# Patient Record
Sex: Female | Born: 1940 | Race: White | Marital: Married | Smoking: Current every day smoker
Health system: Southern US, Community
[De-identification: ages and names within clinical notes are randomized; demographics above are authoritative.]

## PROBLEM LIST (undated history)

## (undated) DIAGNOSIS — C3490 Malignant neoplasm of unspecified part of unspecified bronchus or lung: Secondary | ICD-10-CM

## (undated) DIAGNOSIS — E7800 Pure hypercholesterolemia, unspecified: Secondary | ICD-10-CM

## (undated) DIAGNOSIS — G43909 Migraine, unspecified, not intractable, without status migrainosus: Secondary | ICD-10-CM

## (undated) DIAGNOSIS — I10 Essential (primary) hypertension: Secondary | ICD-10-CM

## (undated) DIAGNOSIS — S32009A Unspecified fracture of unspecified lumbar vertebra, initial encounter for closed fracture: Secondary | ICD-10-CM

## (undated) DIAGNOSIS — G8929 Other chronic pain: Secondary | ICD-10-CM

## (undated) DIAGNOSIS — J449 Chronic obstructive pulmonary disease, unspecified: Secondary | ICD-10-CM

## (undated) DIAGNOSIS — C801 Malignant (primary) neoplasm, unspecified: Secondary | ICD-10-CM

## (undated) DIAGNOSIS — M797 Fibromyalgia: Secondary | ICD-10-CM

## (undated) DIAGNOSIS — M549 Dorsalgia, unspecified: Secondary | ICD-10-CM

## (undated) DIAGNOSIS — I509 Heart failure, unspecified: Secondary | ICD-10-CM

## (undated) DIAGNOSIS — I219 Acute myocardial infarction, unspecified: Secondary | ICD-10-CM

## (undated) DIAGNOSIS — J439 Emphysema, unspecified: Secondary | ICD-10-CM

## (undated) HISTORY — PX: TONSILLECTOMY: SUR1361

## (undated) HISTORY — PX: ABDOMINAL HYSTERECTOMY: SHX81

## (undated) HISTORY — PX: MASTECTOMY: SHX3

## (undated) HISTORY — PX: EYE SURGERY: SHX253

## (undated) HISTORY — DX: Malignant neoplasm of unspecified part of unspecified bronchus or lung: C34.90

## (undated) HISTORY — PX: INGUINAL HERNIA REPAIR: SUR1180

---

## 2005-12-15 ENCOUNTER — Ambulatory Visit: Payer: Self-pay | Admitting: Specialist

## 2005-12-21 ENCOUNTER — Ambulatory Visit: Payer: Self-pay | Admitting: Specialist

## 2005-12-29 ENCOUNTER — Ambulatory Visit: Payer: Self-pay | Admitting: Specialist

## 2006-02-28 ENCOUNTER — Ambulatory Visit: Payer: Self-pay | Admitting: Specialist

## 2007-08-14 ENCOUNTER — Ambulatory Visit: Payer: Self-pay | Admitting: Family Medicine

## 2007-08-26 ENCOUNTER — Ambulatory Visit: Payer: Self-pay | Admitting: Family Medicine

## 2008-11-13 ENCOUNTER — Ambulatory Visit: Payer: Self-pay | Admitting: Family Medicine

## 2008-11-26 ENCOUNTER — Ambulatory Visit: Payer: Self-pay | Admitting: Internal Medicine

## 2009-05-04 ENCOUNTER — Ambulatory Visit: Payer: Self-pay | Admitting: Surgery

## 2009-05-13 ENCOUNTER — Ambulatory Visit: Payer: Self-pay | Admitting: Internal Medicine

## 2009-05-21 ENCOUNTER — Ambulatory Visit: Payer: Self-pay | Admitting: Surgery

## 2009-05-31 ENCOUNTER — Ambulatory Visit: Payer: Self-pay | Admitting: Surgery

## 2009-06-03 ENCOUNTER — Ambulatory Visit: Payer: Self-pay | Admitting: Surgery

## 2010-05-15 HISTORY — PX: KYPHOPLASTY: SHX5884

## 2010-10-14 ENCOUNTER — Ambulatory Visit: Payer: Self-pay | Admitting: Internal Medicine

## 2010-10-15 ENCOUNTER — Inpatient Hospital Stay: Payer: Self-pay | Admitting: Internal Medicine

## 2010-11-07 ENCOUNTER — Ambulatory Visit: Payer: Self-pay

## 2010-11-09 ENCOUNTER — Ambulatory Visit: Payer: Self-pay | Admitting: Specialist

## 2010-11-11 ENCOUNTER — Ambulatory Visit: Payer: Self-pay | Admitting: Specialist

## 2010-11-13 ENCOUNTER — Ambulatory Visit: Payer: Self-pay

## 2010-11-21 ENCOUNTER — Ambulatory Visit: Payer: Self-pay | Admitting: Cardiothoracic Surgery

## 2010-11-22 ENCOUNTER — Telehealth: Payer: Self-pay

## 2010-11-22 DIAGNOSIS — C3490 Malignant neoplasm of unspecified part of unspecified bronchus or lung: Secondary | ICD-10-CM

## 2010-11-22 NOTE — Telephone Encounter (Signed)
Pt scheduled for EUS need to review meds and instruct pt  Left message on machine to call back  

## 2010-11-22 NOTE — Telephone Encounter (Signed)
Pt aware of appt and has been instructed as well as meds reviewed

## 2010-11-24 ENCOUNTER — Ambulatory Visit: Payer: Self-pay

## 2010-11-24 ENCOUNTER — Encounter: Payer: Self-pay | Admitting: Gastroenterology

## 2010-12-07 ENCOUNTER — Encounter: Payer: Self-pay | Admitting: Gastroenterology

## 2010-12-14 ENCOUNTER — Ambulatory Visit: Payer: Self-pay | Admitting: Cardiothoracic Surgery

## 2011-01-05 ENCOUNTER — Ambulatory Visit: Payer: Self-pay | Admitting: Pain Medicine

## 2011-01-09 ENCOUNTER — Inpatient Hospital Stay: Payer: Self-pay | Admitting: Internal Medicine

## 2011-01-14 ENCOUNTER — Ambulatory Visit: Payer: Self-pay | Admitting: Cardiothoracic Surgery

## 2011-02-13 ENCOUNTER — Ambulatory Visit: Payer: Self-pay | Admitting: Cardiothoracic Surgery

## 2011-03-16 ENCOUNTER — Ambulatory Visit: Payer: Self-pay | Admitting: Cardiothoracic Surgery

## 2011-03-27 ENCOUNTER — Inpatient Hospital Stay: Payer: Self-pay | Admitting: Specialist

## 2011-04-15 ENCOUNTER — Ambulatory Visit: Payer: Self-pay | Admitting: Cardiothoracic Surgery

## 2011-05-02 ENCOUNTER — Ambulatory Visit: Payer: Self-pay | Admitting: Surgery

## 2011-05-10 ENCOUNTER — Ambulatory Visit: Payer: Self-pay | Admitting: Unknown Physician Specialty

## 2011-05-11 ENCOUNTER — Ambulatory Visit: Payer: Self-pay | Admitting: Unknown Physician Specialty

## 2011-05-15 LAB — PATHOLOGY REPORT

## 2011-05-16 ENCOUNTER — Ambulatory Visit: Payer: Self-pay | Admitting: Cardiothoracic Surgery

## 2011-05-16 DIAGNOSIS — C801 Malignant (primary) neoplasm, unspecified: Secondary | ICD-10-CM

## 2011-05-16 DIAGNOSIS — I219 Acute myocardial infarction, unspecified: Secondary | ICD-10-CM

## 2011-05-16 HISTORY — DX: Acute myocardial infarction, unspecified: I21.9

## 2011-05-16 HISTORY — DX: Malignant (primary) neoplasm, unspecified: C80.1

## 2011-05-30 ENCOUNTER — Ambulatory Visit: Payer: Self-pay | Admitting: Unknown Physician Specialty

## 2011-06-01 ENCOUNTER — Ambulatory Visit: Payer: Self-pay | Admitting: Unknown Physician Specialty

## 2011-07-03 ENCOUNTER — Ambulatory Visit: Payer: Self-pay | Admitting: Internal Medicine

## 2011-07-03 LAB — BASIC METABOLIC PANEL
Anion Gap: 12 (ref 7–16)
BUN: 4 mg/dL — ABNORMAL LOW (ref 7–18)
Calcium, Total: 9.6 mg/dL (ref 8.5–10.1)
Co2: 30 mmol/L (ref 21–32)
Creatinine: 0.56 mg/dL — ABNORMAL LOW (ref 0.60–1.30)
EGFR (African American): >60
EGFR (Non-African Amer.): >60
Osmolality: 282 (ref 275–301)
Potassium: 3.5 mmol/L (ref 3.5–5.1)
Sodium: 142 mmol/L (ref 136–145)

## 2011-07-03 LAB — CBC CANCER CENTER
Basophil #: 0 x10 3/mm (ref 0.0–0.1)
Eosinophil #: 0 x10 3/mm (ref 0.0–0.7)
HGB: 14.5 g/dL (ref 12.0–16.0)
Lymphocyte #: 0.4 x10 3/mm — ABNORMAL LOW (ref 1.0–3.6)
MCH: 36.1 pg — ABNORMAL HIGH (ref 26.0–34.0)
MCHC: 33.5 g/dL (ref 32.0–36.0)
MCV: 108 fL — ABNORMAL HIGH (ref 80–100)
Monocyte #: 0.7 x10 3/mm (ref 0.0–0.7)
Monocyte %: 12.1 %
Neutrophil #: 4.4 x10 3/mm (ref 1.4–6.5)
RDW: 15 % — ABNORMAL HIGH (ref 11.5–14.5)

## 2011-07-14 ENCOUNTER — Ambulatory Visit: Payer: Self-pay | Admitting: Internal Medicine

## 2011-07-14 ENCOUNTER — Ambulatory Visit: Payer: Self-pay

## 2011-07-18 LAB — HEPATIC FUNCTION PANEL A (ARMC)
Albumin: 3.1 g/dL — ABNORMAL LOW (ref 3.4–5.0)
Bilirubin, Direct: 0.1 mg/dL (ref 0.00–0.20)
Bilirubin,Total: 0.5 mg/dL (ref 0.2–1.0)
SGOT(AST): 15 U/L (ref 15–37)

## 2011-07-18 LAB — BASIC METABOLIC PANEL
Anion Gap: 9 (ref 7–16)
BUN: 8 mg/dL (ref 7–18)
Calcium, Total: 9.2 mg/dL (ref 8.5–10.1)
Chloride: 100 mmol/L (ref 98–107)
Co2: 31 mmol/L (ref 21–32)
EGFR (African American): >60
EGFR (Non-African Amer.): >60
Glucose: 109 mg/dL — ABNORMAL HIGH (ref 65–99)
Osmolality: 278 (ref 275–301)
Potassium: 4 mmol/L (ref 3.5–5.1)
Sodium: 140 mmol/L (ref 136–145)

## 2011-07-18 LAB — CBC CANCER CENTER
Basophil %: 0.2 %
Eosinophil #: 0.1 x10 3/mm (ref 0.0–0.7)
Eosinophil %: 1.4 %
HCT: 44.1 % (ref 35.0–47.0)
HGB: 14.7 g/dL (ref 12.0–16.0)
Lymphocyte #: 0.7 x10 3/mm — ABNORMAL LOW (ref 1.0–3.6)
Lymphocyte %: 14.3 %
MCH: 35.2 pg — ABNORMAL HIGH (ref 26.0–34.0)
MCHC: 33.4 g/dL (ref 32.0–36.0)
MCV: 106 fL — ABNORMAL HIGH (ref 80–100)
Monocyte #: 0.6 x10 3/mm (ref 0.0–0.7)
Monocyte %: 12.4 %
Neutrophil #: 3.6 x10 3/mm (ref 1.4–6.5)
Neutrophil %: 71.7 %

## 2011-07-24 LAB — URINALYSIS, COMPLETE
Bilirubin,UR: NEGATIVE
Ph: 7 (ref 4.5–8.0)
Specific Gravity: 1.005 (ref 1.003–1.030)
Squamous Epithelial: 3

## 2011-07-25 LAB — URINE CULTURE

## 2011-08-14 ENCOUNTER — Ambulatory Visit: Payer: Self-pay

## 2011-08-14 ENCOUNTER — Ambulatory Visit: Payer: Self-pay | Admitting: Internal Medicine

## 2011-10-10 ENCOUNTER — Ambulatory Visit: Payer: Self-pay | Admitting: Internal Medicine

## 2011-10-10 LAB — CBC CANCER CENTER
Basophil #: 0.1 x10 3/mm (ref 0.0–0.1)
Basophil %: 0.9 %
Eosinophil #: 0.3 x10 3/mm (ref 0.0–0.7)
HCT: 44 % (ref 35.0–47.0)
HGB: 14.6 g/dL (ref 12.0–16.0)
Lymphocyte %: 13.9 %
MCHC: 33.1 g/dL (ref 32.0–36.0)
MCV: 102 fL — ABNORMAL HIGH (ref 80–100)
Monocyte #: 0.8 x10 3/mm (ref 0.2–0.9)
Monocyte %: 13.3 %
Neutrophil %: 66.7 %
Platelet: 222 x10 3/mm (ref 150–440)
RBC: 4.31 10*6/uL (ref 3.80–5.20)
RDW: 14.6 % — ABNORMAL HIGH (ref 11.5–14.5)
WBC: 6.1 x10 3/mm (ref 3.6–11.0)

## 2011-10-10 LAB — BASIC METABOLIC PANEL
Anion Gap: 10 (ref 7–16)
BUN: 15 mg/dL (ref 7–18)
Calcium, Total: 9.3 mg/dL (ref 8.5–10.1)
Co2: 29 mmol/L (ref 21–32)
EGFR (African American): >60
EGFR (Non-African Amer.): >60
Glucose: 92 mg/dL (ref 65–99)
Potassium: 3.4 mmol/L — ABNORMAL LOW (ref 3.5–5.1)
Sodium: 144 mmol/L (ref 136–145)

## 2011-10-10 LAB — HEPATIC FUNCTION PANEL A (ARMC)
Alkaline Phosphatase: 77 U/L (ref 50–136)
SGPT (ALT): 21 U/L

## 2011-10-14 ENCOUNTER — Ambulatory Visit: Payer: Self-pay | Admitting: Internal Medicine

## 2011-11-10 ENCOUNTER — Inpatient Hospital Stay: Payer: Self-pay | Admitting: Internal Medicine

## 2011-11-10 LAB — URINALYSIS, COMPLETE
Ketone: NEGATIVE
Leukocyte Esterase: NEGATIVE
Ph: 7 (ref 4.5–8.0)
Protein: NEGATIVE
Squamous Epithelial: 4

## 2011-11-10 LAB — COMPREHENSIVE METABOLIC PANEL
Alkaline Phosphatase: 61 U/L (ref 50–136)
BUN: 14 mg/dL (ref 7–18)
Bilirubin,Total: 0.5 mg/dL (ref 0.2–1.0)
Chloride: 106 mmol/L (ref 98–107)
Creatinine: 0.74 mg/dL (ref 0.60–1.30)
EGFR (African American): >60
EGFR (Non-African Amer.): >60
Osmolality: 285 (ref 275–301)
Potassium: 3.6 mmol/L (ref 3.5–5.1)
SGPT (ALT): 17 U/L
Sodium: 143 mmol/L (ref 136–145)
Total Protein: 7.1 g/dL (ref 6.4–8.2)

## 2011-11-10 LAB — CBC WITH DIFFERENTIAL/PLATELET
Basophil #: 0 10*3/uL (ref 0.0–0.1)
Eosinophil #: 0.1 10*3/uL (ref 0.0–0.7)
Eosinophil %: 1.1 %
HGB: 13.8 g/dL (ref 12.0–16.0)
Lymphocyte %: 9.1 %
MCH: 34.9 pg — ABNORMAL HIGH (ref 26.0–34.0)
MCHC: 33.9 g/dL (ref 32.0–36.0)
MCV: 103 fL — ABNORMAL HIGH (ref 80–100)
Monocyte #: 1.2 x10 3/mm — ABNORMAL HIGH (ref 0.2–0.9)
Monocyte %: 13.4 %
Neutrophil #: 6.8 10*3/uL — ABNORMAL HIGH (ref 1.4–6.5)
Neutrophil %: 75.9 %
RBC: 3.96 10*6/uL (ref 3.80–5.20)
RDW: 14.1 % (ref 11.5–14.5)
WBC: 9 10*3/uL (ref 3.6–11.0)

## 2011-11-13 ENCOUNTER — Ambulatory Visit: Payer: Self-pay | Admitting: Internal Medicine

## 2011-11-15 LAB — CBC WITH DIFFERENTIAL/PLATELET
Basophil #: 0 10*3/uL (ref 0.0–0.1)
Eosinophil #: 0 10*3/uL (ref 0.0–0.7)
HCT: 41 % (ref 35.0–47.0)
Lymphocyte #: 0.4 10*3/uL — ABNORMAL LOW (ref 1.0–3.6)
MCHC: 34.2 g/dL (ref 32.0–36.0)
MCV: 103 fL — ABNORMAL HIGH (ref 80–100)
Neutrophil #: 5.8 10*3/uL (ref 1.4–6.5)
RDW: 14.3 % (ref 11.5–14.5)
WBC: 6.9 10*3/uL (ref 3.6–11.0)

## 2011-11-15 LAB — COMPREHENSIVE METABOLIC PANEL
Alkaline Phosphatase: 59 U/L (ref 50–136)
Bilirubin,Total: 0.6 mg/dL (ref 0.2–1.0)
Co2: 31 mmol/L (ref 21–32)
Creatinine: 0.53 mg/dL — ABNORMAL LOW (ref 0.60–1.30)
EGFR (Non-African Amer.): >60
Osmolality: 283 (ref 275–301)
Sodium: 139 mmol/L (ref 136–145)

## 2011-11-17 LAB — CBC WITH DIFFERENTIAL/PLATELET
Eosinophil %: 0.2 %
Monocyte #: 1.4 x10 3/mm — ABNORMAL HIGH (ref 0.2–0.9)
Monocyte %: 16.9 %
Neutrophil %: 72.3 %
Platelet: 226 10*3/uL (ref 150–440)
WBC: 8.4 10*3/uL (ref 3.6–11.0)

## 2011-11-17 LAB — BASIC METABOLIC PANEL
Anion Gap: 4 — ABNORMAL LOW (ref 7–16)
Calcium, Total: 8.9 mg/dL (ref 8.5–10.1)
Chloride: 100 mmol/L (ref 98–107)
Osmolality: 280 (ref 275–301)
Potassium: 3.9 mmol/L (ref 3.5–5.1)

## 2011-11-20 ENCOUNTER — Ambulatory Visit: Payer: Self-pay | Admitting: Specialist

## 2011-12-04 LAB — BASIC METABOLIC PANEL
Anion Gap: 5 — ABNORMAL LOW (ref 7–16)
Calcium, Total: 9.3 mg/dL (ref 8.5–10.1)
Co2: 34 mmol/L — ABNORMAL HIGH (ref 21–32)
Creatinine: 0.8 mg/dL (ref 0.60–1.30)
EGFR (African American): >60
Osmolality: 286 (ref 275–301)

## 2011-12-04 LAB — CBC CANCER CENTER
Basophil #: 0 x10 3/mm (ref 0.0–0.1)
Eosinophil #: 0.2 x10 3/mm (ref 0.0–0.7)
HCT: 40.6 % (ref 35.0–47.0)
Lymphocyte #: 0.7 x10 3/mm — ABNORMAL LOW (ref 1.0–3.6)
MCHC: 33.2 g/dL (ref 32.0–36.0)
MCV: 105 fL — ABNORMAL HIGH (ref 80–100)
Monocyte #: 0.9 x10 3/mm (ref 0.2–0.9)
Neutrophil #: 5 x10 3/mm (ref 1.4–6.5)
RDW: 15 % — ABNORMAL HIGH (ref 11.5–14.5)

## 2011-12-08 ENCOUNTER — Inpatient Hospital Stay: Payer: Self-pay | Admitting: Internal Medicine

## 2011-12-08 LAB — COMPREHENSIVE METABOLIC PANEL
Alkaline Phosphatase: 79 U/L (ref 50–136)
Bilirubin,Total: 0.7 mg/dL (ref 0.2–1.0)
Calcium, Total: 9.1 mg/dL (ref 8.5–10.1)
Chloride: 101 mmol/L (ref 98–107)
Osmolality: 270 (ref 275–301)
Potassium: 3.9 mmol/L (ref 3.5–5.1)
SGOT(AST): 17 U/L (ref 15–37)
Sodium: 136 mmol/L (ref 136–145)
Total Protein: 7.4 g/dL (ref 6.4–8.2)

## 2011-12-08 LAB — PRO B NATRIURETIC PEPTIDE: B-Type Natriuretic Peptide: 1071 pg/mL — ABNORMAL HIGH

## 2011-12-08 LAB — CBC
HCT: 39.5 %
HGB: 13.9 g/dL
MCH: 35.7 pg — ABNORMAL HIGH
MCHC: 35.1 g/dL
MCV: 102 fL — ABNORMAL HIGH
Platelet: 275 x10 3/mm 3
RBC: 3.89 X10 6/mm 3
RDW: 14.9 % — ABNORMAL HIGH
WBC: 7.4 x10 3/mm 3

## 2011-12-08 LAB — CK TOTAL AND CKMB (NOT AT ARMC)
CK, Total: 55 U/L
CK-MB: 1.6 ng/mL

## 2011-12-08 LAB — TROPONIN I: Troponin-I: <0.02 ng/mL

## 2011-12-09 LAB — BASIC METABOLIC PANEL
Calcium, Total: 9 mg/dL (ref 8.5–10.1)
Chloride: 101 mmol/L (ref 98–107)
EGFR (African American): >60
Glucose: 214 mg/dL — ABNORMAL HIGH (ref 65–99)
Osmolality: 282 (ref 275–301)

## 2011-12-09 LAB — CBC WITH DIFFERENTIAL/PLATELET
Basophil %: 0.2 %
Eosinophil #: 0 10*3/uL (ref 0.0–0.7)
Lymphocyte %: 4.9 %
MCV: 102 fL — ABNORMAL HIGH (ref 80–100)
Monocyte %: 5.8 %
Neutrophil #: 6.1 10*3/uL (ref 1.4–6.5)
Neutrophil %: 89.1 %
Platelet: 261 10*3/uL (ref 150–440)
RDW: 14.7 % — ABNORMAL HIGH (ref 11.5–14.5)
WBC: 6.8 10*3/uL (ref 3.6–11.0)

## 2011-12-09 LAB — FOLATE: Folic Acid: 28.1 ng/mL (ref 3.1–100.0)

## 2011-12-13 LAB — CULTURE, BLOOD (SINGLE)

## 2011-12-14 ENCOUNTER — Ambulatory Visit: Payer: Self-pay | Admitting: Internal Medicine

## 2012-01-24 ENCOUNTER — Ambulatory Visit: Payer: Self-pay | Admitting: Internal Medicine

## 2012-01-24 LAB — BASIC METABOLIC PANEL
Anion Gap: 4 — ABNORMAL LOW (ref 7–16)
BUN: 13 mg/dL (ref 7–18)
Calcium, Total: 9 mg/dL (ref 8.5–10.1)
Chloride: 101 mmol/L (ref 98–107)
Creatinine: 0.8 mg/dL (ref 0.60–1.30)
EGFR (African American): >60
Osmolality: 281 (ref 275–301)
Potassium: 3.7 mmol/L (ref 3.5–5.1)

## 2012-01-24 LAB — HEPATIC FUNCTION PANEL A (ARMC)
Albumin: 3.4 g/dL (ref 3.4–5.0)
Bilirubin, Direct: 0.1 mg/dL (ref 0.00–0.20)
Bilirubin,Total: 0.4 mg/dL (ref 0.2–1.0)
SGOT(AST): 21 U/L (ref 15–37)
Total Protein: 6.7 g/dL (ref 6.4–8.2)

## 2012-01-24 LAB — CBC CANCER CENTER
Basophil #: 0.1 x10 3/mm (ref 0.0–0.1)
Eosinophil %: 0.9 %
Monocyte #: 0.9 x10 3/mm (ref 0.2–0.9)
Monocyte %: 11.8 %
Neutrophil %: 75.7 %
Platelet: 253 x10 3/mm (ref 150–440)
RBC: 4.35 10*6/uL (ref 3.80–5.20)
WBC: 7.9 x10 3/mm (ref 3.6–11.0)

## 2012-02-13 ENCOUNTER — Ambulatory Visit: Payer: Self-pay | Admitting: Internal Medicine

## 2012-04-01 ENCOUNTER — Ambulatory Visit: Payer: Self-pay | Admitting: Internal Medicine

## 2012-04-26 ENCOUNTER — Ambulatory Visit: Payer: Self-pay | Admitting: Ophthalmology

## 2012-05-02 ENCOUNTER — Ambulatory Visit: Payer: Self-pay | Admitting: Ophthalmology

## 2012-05-06 ENCOUNTER — Ambulatory Visit: Payer: Self-pay | Admitting: Ophthalmology

## 2012-05-16 ENCOUNTER — Ambulatory Visit: Payer: Self-pay | Admitting: Sports Medicine

## 2012-05-27 ENCOUNTER — Ambulatory Visit: Payer: Self-pay | Admitting: Ophthalmology

## 2012-07-01 ENCOUNTER — Emergency Department: Payer: Self-pay | Admitting: Emergency Medicine

## 2012-07-01 LAB — URINALYSIS, COMPLETE
Bacteria: NONE SEEN
Glucose,UR: NEGATIVE mg/dL (ref 0–75)
Nitrite: NEGATIVE
Ph: 8 (ref 4.5–8.0)
Specific Gravity: 1.013 (ref 1.003–1.030)

## 2012-07-01 LAB — COMPREHENSIVE METABOLIC PANEL
Albumin: 3.9 g/dL (ref 3.4–5.0)
Anion Gap: 5 — ABNORMAL LOW (ref 7–16)
BUN: 12 mg/dL (ref 7–18)
Calcium, Total: 9.3 mg/dL (ref 8.5–10.1)
Chloride: 100 mmol/L (ref 98–107)
Co2: 33 mmol/L — ABNORMAL HIGH (ref 21–32)
Creatinine: 0.66 mg/dL (ref 0.60–1.30)
EGFR (Non-African Amer.): >60
Osmolality: 275 (ref 275–301)
Potassium: 3.9 mmol/L (ref 3.5–5.1)
SGOT(AST): 26 U/L (ref 15–37)
Sodium: 138 mmol/L (ref 136–145)

## 2012-07-01 LAB — CBC
HCT: 47.4 % — ABNORMAL HIGH (ref 35.0–47.0)
HGB: 16.1 g/dL — ABNORMAL HIGH (ref 12.0–16.0)
MCH: 34.8 pg — ABNORMAL HIGH (ref 26.0–34.0)
MCHC: 33.9 g/dL (ref 32.0–36.0)
MCV: 103 fL — ABNORMAL HIGH (ref 80–100)
RBC: 4.62 10*6/uL (ref 3.80–5.20)
RDW: 14.3 % (ref 11.5–14.5)

## 2012-07-19 ENCOUNTER — Ambulatory Visit: Payer: Self-pay | Admitting: Internal Medicine

## 2012-07-22 LAB — CBC CANCER CENTER
Basophil %: 1.2 %
Eosinophil #: 0 x10 3/mm (ref 0.0–0.7)
Eosinophil %: 0.8 %
HGB: 14.6 g/dL (ref 12.0–16.0)
Lymphocyte %: 14.8 %
MCH: 34.7 pg — ABNORMAL HIGH (ref 26.0–34.0)
MCHC: 34 g/dL (ref 32.0–36.0)
Monocyte #: 0.7 x10 3/mm (ref 0.2–0.9)
Monocyte %: 11.5 %
Neutrophil #: 4.3 x10 3/mm (ref 1.4–6.5)
Neutrophil %: 71.7 %
RDW: 14.2 % (ref 11.5–14.5)
WBC: 6 x10 3/mm (ref 3.6–11.0)

## 2012-07-22 LAB — HEPATIC FUNCTION PANEL A (ARMC)
Albumin: 3.6 g/dL (ref 3.4–5.0)
Bilirubin, Direct: 0.2 mg/dL (ref 0.00–0.20)
Bilirubin,Total: 0.8 mg/dL (ref 0.2–1.0)
SGPT (ALT): 17 U/L (ref 12–78)
Total Protein: 7.4 g/dL (ref 6.4–8.2)

## 2012-07-22 LAB — BASIC METABOLIC PANEL WITH GFR
Anion Gap: 6 — ABNORMAL LOW
BUN: 17 mg/dL
Calcium, Total: 9.4 mg/dL
Chloride: 104 mmol/L
Co2: 33 mmol/L — ABNORMAL HIGH
Creatinine: 0.73 mg/dL
EGFR (African American): >60
EGFR (Non-African Amer.): >60
Glucose: 89 mg/dL
Osmolality: 286
Potassium: 3.9 mmol/L
Sodium: 143 mmol/L

## 2012-08-13 ENCOUNTER — Ambulatory Visit: Payer: Self-pay | Admitting: Internal Medicine

## 2012-11-12 ENCOUNTER — Ambulatory Visit: Payer: Self-pay | Admitting: Internal Medicine

## 2012-11-12 LAB — BASIC METABOLIC PANEL
BUN: 10 mg/dL (ref 7–18)
Calcium, Total: 9.3 mg/dL (ref 8.5–10.1)
Co2: 34 mmol/L — ABNORMAL HIGH (ref 21–32)
Creatinine: 0.77 mg/dL (ref 0.60–1.30)
EGFR (African American): >60
Osmolality: 280 (ref 275–301)
Potassium: 3.5 mmol/L (ref 3.5–5.1)

## 2012-11-12 LAB — CBC CANCER CENTER
Basophil #: 0.1 x10 3/mm (ref 0.0–0.1)
Basophil %: 0.9 %
Eosinophil #: 0.2 x10 3/mm (ref 0.0–0.7)
HCT: 44.1 % (ref 35.0–47.0)
Lymphocyte #: 0.8 x10 3/mm — ABNORMAL LOW (ref 1.0–3.6)
Lymphocyte %: 14.8 %
MCH: 35.5 pg — ABNORMAL HIGH (ref 26.0–34.0)
MCHC: 34.9 g/dL (ref 32.0–36.0)
Monocyte #: 0.7 x10 3/mm (ref 0.2–0.9)
Monocyte %: 13.1 %
Neutrophil #: 3.8 x10 3/mm (ref 1.4–6.5)
Platelet: 219 x10 3/mm (ref 150–440)
RDW: 14 % (ref 11.5–14.5)

## 2012-11-12 LAB — HEPATIC FUNCTION PANEL A (ARMC)
SGOT(AST): 17 U/L (ref 15–37)
SGPT (ALT): 17 U/L (ref 12–78)
Total Protein: 7.3 g/dL (ref 6.4–8.2)

## 2012-12-13 ENCOUNTER — Ambulatory Visit: Payer: Self-pay | Admitting: Internal Medicine

## 2013-02-12 ENCOUNTER — Ambulatory Visit: Payer: Self-pay | Admitting: Internal Medicine

## 2013-02-12 LAB — CBC CANCER CENTER
Basophil #: 0.1 x10 3/mm (ref 0.0–0.1)
Basophil %: 0.6 %
Eosinophil #: 0 x10 3/mm (ref 0.0–0.7)
HCT: 48.2 % — ABNORMAL HIGH (ref 35.0–47.0)
HGB: 16.3 g/dL — ABNORMAL HIGH (ref 12.0–16.0)
MCH: 35.1 pg — ABNORMAL HIGH (ref 26.0–34.0)
MCHC: 33.7 g/dL (ref 32.0–36.0)
MCV: 104 fL — ABNORMAL HIGH (ref 80–100)
Monocyte %: 10.4 %
Neutrophil #: 8.4 x10 3/mm — ABNORMAL HIGH (ref 1.4–6.5)
Neutrophil %: 76.2 %
Platelet: 209 x10 3/mm (ref 150–440)
RBC: 4.64 10*6/uL (ref 3.80–5.20)

## 2013-02-12 LAB — HEPATIC FUNCTION PANEL A (ARMC)
Alkaline Phosphatase: 63 U/L (ref 50–136)
Bilirubin, Direct: 0.1 mg/dL (ref 0.00–0.20)
Bilirubin,Total: 0.5 mg/dL (ref 0.2–1.0)
SGOT(AST): 17 U/L (ref 15–37)
SGPT (ALT): 20 U/L (ref 12–78)
Total Protein: 7.3 g/dL (ref 6.4–8.2)

## 2013-02-12 LAB — BASIC METABOLIC PANEL
Anion Gap: 6 — ABNORMAL LOW (ref 7–16)
BUN: 20 mg/dL — ABNORMAL HIGH (ref 7–18)
Calcium, Total: 9 mg/dL (ref 8.5–10.1)
Chloride: 103 mmol/L (ref 98–107)
Creatinine: 0.76 mg/dL (ref 0.60–1.30)
EGFR (Non-African Amer.): >60
Sodium: 144 mmol/L (ref 136–145)

## 2013-02-12 LAB — URINALYSIS, COMPLETE
Bilirubin,UR: NEGATIVE
Blood: NEGATIVE
Glucose,UR: NEGATIVE mg/dL (ref 0–75)
Ketone: NEGATIVE
Nitrite: NEGATIVE
Ph: 5 (ref 4.5–8.0)
Protein: NEGATIVE
RBC,UR: 1 /HPF (ref 0–5)
Squamous Epithelial: 2

## 2013-02-13 LAB — URINE CULTURE

## 2013-03-15 ENCOUNTER — Ambulatory Visit: Payer: Self-pay | Admitting: Internal Medicine

## 2013-06-04 ENCOUNTER — Ambulatory Visit: Payer: Self-pay | Admitting: Internal Medicine

## 2013-06-04 LAB — BASIC METABOLIC PANEL
Anion Gap: 5 — ABNORMAL LOW (ref 7–16)
BUN: 14 mg/dL (ref 7–18)
CALCIUM: 8.7 mg/dL (ref 8.5–10.1)
CHLORIDE: 101 mmol/L (ref 98–107)
CO2: 37 mmol/L — AB (ref 21–32)
Creatinine: 0.89 mg/dL (ref 0.60–1.30)
EGFR (Non-African Amer.): >60
Glucose: 108 mg/dL — ABNORMAL HIGH (ref 65–99)
Osmolality: 286 (ref 275–301)
POTASSIUM: 3.9 mmol/L (ref 3.5–5.1)
Sodium: 143 mmol/L (ref 136–145)

## 2013-06-04 LAB — CBC CANCER CENTER
BASOS PCT: 1.3 %
Basophil #: 0.1 x10 3/mm (ref 0.0–0.1)
EOS ABS: 0.1 x10 3/mm (ref 0.0–0.7)
EOS PCT: 1.1 %
HCT: 45.3 % (ref 35.0–47.0)
HGB: 15.2 g/dL (ref 12.0–16.0)
LYMPHS PCT: 16.5 %
Lymphocyte #: 1.3 x10 3/mm (ref 1.0–3.6)
MCH: 34.4 pg — ABNORMAL HIGH (ref 26.0–34.0)
MCHC: 33.5 g/dL (ref 32.0–36.0)
MCV: 103 fL — ABNORMAL HIGH (ref 80–100)
MONO ABS: 0.9 x10 3/mm (ref 0.2–0.9)
MONOS PCT: 11.6 %
NEUTROS ABS: 5.6 x10 3/mm (ref 1.4–6.5)
Neutrophil %: 69.5 %
PLATELETS: 264 x10 3/mm (ref 150–440)
RBC: 4.41 10*6/uL (ref 3.80–5.20)
RDW: 13.5 % (ref 11.5–14.5)
WBC: 8 x10 3/mm (ref 3.6–11.0)

## 2013-06-04 LAB — HEPATIC FUNCTION PANEL A (ARMC)
ALK PHOS: 63 U/L
AST: 13 U/L — AB (ref 15–37)
Albumin: 3.3 g/dL — ABNORMAL LOW (ref 3.4–5.0)
Bilirubin, Direct: 0.1 mg/dL (ref 0.00–0.20)
Bilirubin,Total: 0.6 mg/dL (ref 0.2–1.0)
SGPT (ALT): 12 U/L (ref 12–78)
TOTAL PROTEIN: 6.7 g/dL (ref 6.4–8.2)

## 2013-06-15 ENCOUNTER — Ambulatory Visit: Payer: Self-pay | Admitting: Internal Medicine

## 2013-09-24 ENCOUNTER — Ambulatory Visit: Payer: Self-pay | Admitting: Internal Medicine

## 2013-09-24 LAB — CBC CANCER CENTER
BASOS ABS: 0.1 x10 3/mm (ref 0.0–0.1)
Basophil %: 1.1 %
Eosinophil #: 0 x10 3/mm (ref 0.0–0.7)
Eosinophil %: 0.6 %
HCT: 49.5 % — ABNORMAL HIGH (ref 35.0–47.0)
HGB: 16.6 g/dL — AB (ref 12.0–16.0)
LYMPHS PCT: 14.3 %
Lymphocyte #: 1 x10 3/mm (ref 1.0–3.6)
MCH: 34.4 pg — ABNORMAL HIGH (ref 26.0–34.0)
MCHC: 33.6 g/dL (ref 32.0–36.0)
MCV: 102 fL — ABNORMAL HIGH (ref 80–100)
Monocyte #: 0.8 x10 3/mm (ref 0.2–0.9)
Monocyte %: 11.5 %
NEUTROS PCT: 72.5 %
Neutrophil #: 5.2 x10 3/mm (ref 1.4–6.5)
PLATELETS: 190 x10 3/mm (ref 150–440)
RBC: 4.84 10*6/uL (ref 3.80–5.20)
RDW: 13.3 % (ref 11.5–14.5)
WBC: 7.1 x10 3/mm (ref 3.6–11.0)

## 2013-09-24 LAB — BASIC METABOLIC PANEL
ANION GAP: 6 — AB (ref 7–16)
BUN: 13 mg/dL (ref 7–18)
CALCIUM: 9.3 mg/dL (ref 8.5–10.1)
CHLORIDE: 102 mmol/L (ref 98–107)
Co2: 37 mmol/L — ABNORMAL HIGH (ref 21–32)
Creatinine: 0.63 mg/dL (ref 0.60–1.30)
EGFR (African American): >60
Glucose: 109 mg/dL — ABNORMAL HIGH (ref 65–99)
Osmolality: 289 (ref 275–301)
Potassium: 3.4 mmol/L — ABNORMAL LOW (ref 3.5–5.1)
Sodium: 145 mmol/L (ref 136–145)

## 2013-09-24 LAB — HEPATIC FUNCTION PANEL A (ARMC)
ALK PHOS: 59 U/L
Albumin: 3.9 g/dL (ref 3.4–5.0)
BILIRUBIN DIRECT: 0.2 mg/dL (ref 0.00–0.20)
BILIRUBIN TOTAL: 1.3 mg/dL — AB (ref 0.2–1.0)
SGOT(AST): 13 U/L — ABNORMAL LOW (ref 15–37)
SGPT (ALT): 12 U/L (ref 12–78)
Total Protein: 7.6 g/dL (ref 6.4–8.2)

## 2013-10-07 DIAGNOSIS — Z859 Personal history of malignant neoplasm, unspecified: Secondary | ICD-10-CM | POA: Insufficient documentation

## 2013-10-07 DIAGNOSIS — J449 Chronic obstructive pulmonary disease, unspecified: Secondary | ICD-10-CM | POA: Insufficient documentation

## 2013-10-13 ENCOUNTER — Ambulatory Visit: Payer: Self-pay | Admitting: Internal Medicine

## 2013-11-03 ENCOUNTER — Ambulatory Visit: Payer: Self-pay | Admitting: Internal Medicine

## 2013-11-12 ENCOUNTER — Ambulatory Visit: Payer: Self-pay | Admitting: Internal Medicine

## 2013-11-18 DIAGNOSIS — R0602 Shortness of breath: Secondary | ICD-10-CM | POA: Insufficient documentation

## 2014-01-22 ENCOUNTER — Ambulatory Visit: Payer: Self-pay | Admitting: Internal Medicine

## 2014-02-12 ENCOUNTER — Ambulatory Visit: Payer: Self-pay | Admitting: Internal Medicine

## 2014-03-23 ENCOUNTER — Ambulatory Visit: Payer: Self-pay | Admitting: Internal Medicine

## 2014-03-23 LAB — CBC CANCER CENTER
BASOS PCT: 0.7 %
Basophil #: 0.1 x10 3/mm (ref 0.0–0.1)
EOS ABS: 0.1 x10 3/mm (ref 0.0–0.7)
EOS PCT: 0.9 %
HCT: 47.8 % — AB (ref 35.0–47.0)
HGB: 16 g/dL (ref 12.0–16.0)
LYMPHS ABS: 1.3 x10 3/mm (ref 1.0–3.6)
Lymphocyte %: 16.7 %
MCH: 35.2 pg — ABNORMAL HIGH (ref 26.0–34.0)
MCHC: 33.5 g/dL (ref 32.0–36.0)
MCV: 105 fL — ABNORMAL HIGH (ref 80–100)
Monocyte #: 1 x10 3/mm — ABNORMAL HIGH (ref 0.2–0.9)
Monocyte %: 12.5 %
NEUTROS ABS: 5.3 x10 3/mm (ref 1.4–6.5)
Neutrophil %: 69.2 %
Platelet: 181 x10 3/mm (ref 150–440)
RBC: 4.55 10*6/uL (ref 3.80–5.20)
RDW: 14 % (ref 11.5–14.5)
WBC: 7.6 x10 3/mm (ref 3.6–11.0)

## 2014-03-23 LAB — HEPATIC FUNCTION PANEL A (ARMC)
Albumin: 3.7 g/dL (ref 3.4–5.0)
Alkaline Phosphatase: 60 U/L
Bilirubin, Direct: 0.2 mg/dL (ref 0.0–0.2)
Bilirubin,Total: 0.8 mg/dL (ref 0.2–1.0)
SGOT(AST): 14 U/L — ABNORMAL LOW (ref 15–37)
SGPT (ALT): 17 U/L
Total Protein: 7 g/dL (ref 6.4–8.2)

## 2014-03-23 LAB — BASIC METABOLIC PANEL
Anion Gap: 5 — ABNORMAL LOW (ref 7–16)
BUN: 12 mg/dL (ref 7–18)
CHLORIDE: 102 mmol/L (ref 98–107)
CREATININE: 0.74 mg/dL (ref 0.60–1.30)
Calcium, Total: 9.5 mg/dL (ref 8.5–10.1)
Co2: 37 mmol/L — ABNORMAL HIGH (ref 21–32)
EGFR (African American): >60
EGFR (Non-African Amer.): >60
GLUCOSE: 107 mg/dL — AB (ref 65–99)
Osmolality: 287 (ref 275–301)
POTASSIUM: 3.6 mmol/L (ref 3.5–5.1)
Sodium: 144 mmol/L (ref 136–145)

## 2014-04-14 ENCOUNTER — Ambulatory Visit: Payer: Self-pay | Admitting: Internal Medicine

## 2014-05-21 ENCOUNTER — Ambulatory Visit: Payer: Self-pay | Admitting: General Practice

## 2014-05-26 DIAGNOSIS — M5136 Other intervertebral disc degeneration, lumbar region: Secondary | ICD-10-CM | POA: Insufficient documentation

## 2014-05-26 DIAGNOSIS — M5116 Intervertebral disc disorders with radiculopathy, lumbar region: Secondary | ICD-10-CM | POA: Insufficient documentation

## 2014-07-06 DIAGNOSIS — M9961 Osseous and subluxation stenosis of intervertebral foramina of cervical region: Secondary | ICD-10-CM | POA: Insufficient documentation

## 2014-07-07 DIAGNOSIS — M48061 Spinal stenosis, lumbar region without neurogenic claudication: Secondary | ICD-10-CM | POA: Insufficient documentation

## 2014-08-18 ENCOUNTER — Ambulatory Visit
Admit: 2014-08-18 | Disposition: A | Discharge status: Home / Self Care | Payer: Self-pay | Attending: Internal Medicine | Admitting: Internal Medicine

## 2014-09-01 NOTE — Op Note (Signed)
PATIENT NAME:  Amber Harrison, MERCER MR#:  [id number] DATE OF BIRTH:  July 07, 1940  DATE OF PROCEDURE:  05/06/2012  PREOPERATIVE DIAGNOSIS: Cataract, right eye.   POSTOPERATIVE DIAGNOSIS: Cataract, right eye.   PROCEDURE PERFORMED: Extracapsular cataract extraction using phacoemulsification with placement of an Alcon SN6CWS, 21.5-diopter posterior chamber lens, serial number [id number].    SURGEON: Loura Back. Chastelyn Athens, MD   ANESTHESIA: 4% lidocaine and 0.75% Marcaine a 50-50 mixture with 10 units/mL of Hylenex added, given as a peribulbar.   ANESTHESIOLOGIST: Dr. Benjamine Mola.   COMPLICATIONS: None.   ESTIMATED BLOOD LOSS: Less than 1 mL.   DESCRIPTION OF PROCEDURE: The patient was brought to the Operating Room and given IV sedation and peribulbar block. She was then prepped and draped in the usual fashion. The vertical rectus muscles were imbricated using 5-0 silk sutures, bridle sutures. A limbal peritomy was carried out 12:00, and hemostasis was obtained using cautery. A partial thickness scleral groove was made at the posterior surgical limbus and dissected anteriorly into clear cornea with an Alcon crescent knife. The chamber was entered superonasally and superotemporally through clear cornea with a paracentesis knife. Air was used to replace the aqueous and Vision Blue was then used to paint the anterior capsule. The air was replaced with DisCoVisc, and the anterior chamber was entered through the lamellar dissection with a 2.6 mm keratome. Continuous tear circular capsulorrhexis was carried out. Hydrodelineation was used to loosen the nucleus, and phacoemulsification was carried out in a divide and conquer technique. Ultrasound time was 2 minutes and 45 seconds with an average power of 23.9%, CDE 61.54. Irrigation and aspiration was used to remove the residual cortex. The capsular bag was inflated with DisCoVisc, and the intraocular lens was inserted under direct visualization. Irrigation-aspiration was  used to remove the residual DisCoVisc. The wound was inflated with balanced salt, and the Miostat was injected through the paracentesis track. The wound was checked for leaks. None were found. The conjunctiva was closed with cautery. The bridle sutures were removed, and two drops of Vigamox were placed on the eye. A shield was placed on the eye. The patient was discharged to the recovery area in good condition.  ____________________________ Loura Back Sabri Teal, MD sad:cb D: 05/06/2012 12:46:31 ET T: 05/06/2012 13:21:22 ET JOB#: [id number]  cc: Remo Lipps A. Christianjames Soule, MD, <Dictator> Martie Lee MD ELECTRONICALLY SIGNED 05/13/2012 13:23

## 2014-09-01 NOTE — Discharge Summary (Signed)
PATIENT NAME:  Amber Harrison, Amber Harrison MR#:  [id number] DATE OF BIRTH:  [birth year]  DATE OF ADMISSION:  12/08/2011 DATE OF DISCHARGE:  12/11/2011  ADMITTING DIAGNOSIS: Chronic obstructive pulmonary disease exacerbation.  DISCHARGE DIAGNOSES:  1. Acute on chronic respiratory failure. 2. Chronic obstructive pulmonary disease exacerbation.  3. Acute bronchitis.  4. Hypertension. 5. Hyperlipidemia. 6. Chronic pain syndrome. 7. Osteoporosis. 8. Former tobacco abuse with current nicotine cravings. 9. Constipation.  10. Oral candidiasis.  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   DISCHARGE CONDITION: Stable.   DISCHARGE MEDICATIONS: The patient is to resume her outpatient medications which include the following. 1. Spiriva one inhalation once daily.  2. Pravachol 10 mg p.o. daily.  3. Aspirin 81 mg p.o. daily.  4. Protonix 40 mg p.o. daily. 5. Wellbutrin 75 mg p.o. p.r.n.  6. Toprol-XL 50 mg p.o. daily.  7. Oxycodone 10 mg p.o. 1 to 2 tablets every 4 to 6 hours as needed.  8. Morphine extended-release 15 mg p.o. every eight hours.   ADDITIONAL MEDICATIONS:  1. Senokot one tablet p.o. twice daily as needed.  2. Antacid double-strength suspension 5 mL p.o. daily as needed.  3. Mucinex 600 mg p.o. twice daily.  4. Megace suspension 800 mg p.o. daily.  5. Mylanta 80 mg p.o. before each meal and at bedtime.  6. Calcium carbonate with vitamin D 1 tablet p.o. twice daily.  7. Vitamin D3 1000 units p.o. daily.  8. Milk of Magnesia 30 mL p.o. twice daily.  9. Nystatin 5 mL every six hours as needed.  10. Levaquin 250 mg p.o. daily for three more days to complete a seven-day course.  11. Zestril 5 mg p.o. daily.  12. Prednisone 60 mg p.o. once on 12/12/2011 then taper x 10 mg every two days until stopped.  13. Nicotine oral inhaler one cartridge every one hour as needed.  14. Nicotine patch 7 mg topically daily.  HOME OXYGEN: 3 liters of oxygen through nasal cannula.   DIET: 2 grams salt, low fat, low  cholesterol, regular consistency.   ACTIVITY LIMITATIONS: As tolerated.   DISCHARGE FOLLOWUP: Followup with Dr. Quay Burow in 2 days after discharge.  CONSULTANTS: Care Management.   RADIOLOGIC STUDIES: Chest x-ray, portable single view, on 12/08/2011, showed atelectasis versus infiltrate within the suprahilar region on the right, chronic obstructive pulmonary disease and competent of pulmonary fibrosis. Repeated chest x-ray, PA and lateral, on 12/09/2011, revealed findings consistent with chronic obstructive pulmonary disease and likely left basilar atelectasis. There is stable appearance of the increased interstitial markings bilaterally and of the left perihilar density.  HISTORY OF PRESENT ILLNESS: The patient is a 74 year old Caucasian female with past medical history significant for history of chronic obstructive pulmonary disease who presented to the hospital with complaints of shortness of breath.  Please refer to Dr. Hilma Favors Gutierrez's admission note on 12/08/2011. Apparently the patient was discharged on 11/17/2011 and came back with complaints of increasing shortness of breath, increased secretions, phlegm as well as wheezing. She was treated with antibiotics and was started on a course of Avelox as an outpatient by her pulmonologist, however, the patient did not show significant improvement in her symptoms and decided to come to the Emergency Room for further evaluation.   On arrival to the Emergency Room, the patient's blood pressure was 149/78 and saturation was 94% on 2 liters of oxygen through nasal cannula, heart rate was 120. She was afebrile. Her physical exam revealed wheezing as well as bilateral rhonchi diffuse all over bilateral lung fields.  ADDITIONAL STUDY RESULTS: The patient's lab data on 12/08/2011 showed elevated B-type natriuretic peptide of 1,071, glucose was 105, and  BMP was unremarkable. The patient's liver enzymes were within normal limits. Cardiac enzymes,  first set, was negative. White blood cell count was normal at 7.4, hemoglobin 15.9, platelet count 275, and MCV was elevated at 102.   Blood cultures x2 did not show any growth.   EKG showed sinus tachycardia at 173 beats per minute, biatrial enlargement, and left ventricular hypertrophy with repolarization abnormality but no acute ST-T changes were noted.   Chest x-ray revealed atelectasis versus infiltrate in the suprahilar region on the right as well as pulmonary fibrosis.   HOSPITAL COURSE: The patient was admitted to the hospital for further evaluation. She was started on steroids and antibiotic therapy with IV Levaquin as well as inhalation therapy. With this therapy she significantly improved. She however was not able to lift much sputum, but was not complaining anymore of significant shortness of breath or discomfort. Her physical exam also showed improved lung fields. She did have a few rhonchi as well as wheezes on the left, minimal and scattered, otherwise air entrance was satisfactory. Oxygenation also remained stable on 3 liters of oxygen through nasal cannula. We tried to wean her off oxygen, however, her oxygenation was still 89% on room air, but her oxygen level increased to 93 on 2 liters of oxygen through nasal cannula. However, she felt much more comfortable whenever she was on 3 liters and oxygen saturation was 94% to 95%. It was felt that she should continue 3 liters and slowly wean down to a baseline of 2 liters of oxygen through nasal cannula as tolerated. The patient is to continue steroid tapering as well as antibiotic therapy. She is to follow up with her primary care physician, Dr. Quay Burow, in the next few days after discharge. She is to continue Mucinex. She was also advised to continue inhalation therapy.   It was unclear if the patient had bronchitis or very small pneumonia in the suprahilar area. However, the patient's chest x-ray done on 12/08/2011 showed changes in right side  suprahilar area and on 12/09/2011 it showed left perihilar density. For this reason it was felt that it could have been just atelectasis, which was noted on x-rays. It is recommended to follow the patient's chest x-ray as outpatient, especially if her condition does not improve.  The patient was noted to have significant hypertension and she was started on blood pressure medications. Because of chest x-ray revealing increased interstitial markings concern was that the patient could have been retaining some fluid. For this reason, Lasix was initiated while she was in the hospital and it was felt that the patient would benefit from an ACE inhibitor. It was in fact unclear if the patient had an element of congestive heart failure. However, looking into prior studies, including echocardiogram done in August 2012, which showed a normal ejection fraction with ejection fraction of 50% as well as moderate concentric left ventricular hypertrophy, it was felt that the patient could have an element of fluid retention related to her tachycardia. For this reason, she is to continue beta blockers as well as ACE inhibitor was initiated.  On the day of discharge, the patient's blood pressure is better controlled and her vitals were all much more stable. Her temperature was normal at 98, pulse as 80s to 90s, respiration rate was 18, blood pressure 138/84, and oxygen saturation 94% to 95% on room air at rest.  It is recommended to advance the patient's blood pressure medications depending on her need. It is also recommended to add Lasix if the patient's condition does not seem to be improving significantly and possibly also reassess her echocardiogram. However, this was not done during this admission as the patient's condition truly improved with management of her chronic obstructive pulmonary disease exacerbation.   In regards to hyperlipidemia, the patient is to continue her Pravachol. No changes were made in this medication.    For chronic pain, she is to continue her outpatient medications. Her pain was satisfactorily controlled.  For osteoporosis, osteoarthritis she is to continue calcium and vitamin D supplementation. She was wondering if she could get IV medications for osteoporosis while she was in the hospital, however, we were not sure what medication she was receiving as an outpatient, in Dr. Scharlene Gloss office. I ordered for her Jaclyn Prime, however, Jaclyn Prime was not available until Tuesday, 12/12/2011, and as the patient was concerned about her not getting enough calcium supplements prior to administration of this particular medication, it was felt that it would best served for her to be discharged home on calcium with vitamin supplementation. She is to follow up with Dr. Scharlene Gloss office in approximately one week after discharge and receive her usual osteoporosis medication.   In regards to tobacco abuse and nicotine cravings, the patient was anxious to smoke. I discussed with her smoking concerns and I initiated a nicotine oral inhaler as well as nicotine patch to help her with cravings.   The patient had oral candidiasis for which nystatin was initiated which she is to use every six hours as needed.   The patient was noted, as mentioned above, to have malignant hypertension with blood pressure readings shooting as high as 160s and she was initiated on additional medication, Lisinopril, the dose of which should be advanced as an outpatient to improve cardiac afterload.  The patient is being discharged in stable condition with the above-mentioned medications and follow-up.   TIME SPENT: 40 minutes.  ____________________________ Theodoro Grist, MD rv:slb D: 12/11/2011 48:88:91 ET T: 12/12/2011 14:35:40 ET JOB#: [id number]  cc: Theodoro Grist, MD, <Dictator> Ellamae Sia, MD Michaelia Beilfuss Ether Griffins MD ELECTRONICALLY SIGNED 12/18/2011 1:04

## 2014-09-01 NOTE — H&P (Signed)
PATIENT NAME:  Amber Harrison, Amber Harrison MR#:  [id number] DATE OF BIRTH:  10/09/1940  DATE OF ADMISSION:  12/08/2011  REASON FOR ADMISSION: Chronic obstructive pulmonary disease exacerbation/chronic respiratory failure with oxygen dependence.  HISTORY OF PRESENT ILLNESS:  Ms. Mielke is a very nice 74 year old female who has multiple medical problems including chronic obstructive pulmonary disease, smoking/tobacco abuse, chronic respiratory failure which is oxygen dependent, history of non small lung cell carcinoma which has been treated with chemotherapy and radiation and is apparently resolved. She also has a history of myocardial infarction and coronary artery disease and valvular heart disease as well as hypertension. She is coming here today after being discharged on 11/17/2011 with a complaint of increased shortness of breath, increased secretions, phlegm and wheezing. She has been treated with antibiotics since her discharge on 07/05. She was started on new course of Avelox as an outpatient for five days by her pulmonologist, but he has been on vacation, so the last couple of days when she decided to go visit again, she needed to followup with her oncologist, Dr. Ma Hillock, who gave her a prescription for seven more days of Avelox. She has not really noticed any significant improvement in her symptoms, so she decided to come today to the Emergency Room. She states that for the past two to four days, the increasing shortness of breath has been gradual but has been more evident within the past two days and she states that yesterday it was really bad, to the point that she was not able to eat or get around easily due to shortness of breath and cough. The cough seems to be constant, hacking, productive with phlegm which is whitish to yellowish color. She denies any hematemesis or hemoptysis. She states that she has not had any fever although she has been feeling warm for what she suspected she has been running what she calls a  low grade fever. She is sweating significantly and having some chills occasionally. She feels very tired and weak and has occasional shivering. She is very concerned about her lung function and she has questions about how advanced her disease is. We had a long conversation in which I explained that her problem is likely not reversible by definition, but it might improve if she stopped smoking.  At the moment the patient is in the ER. She is comfortable after a couple of nebulizer treatments. She is starting to feel a little bit better. Her x-ray today shows mostly atelectasis. No new significant infiltrates for what we are going to treat it as a chronic obstructive pulmonary disease exacerbation.   PAST MEDICAL HISTORY:   1. Chronic obstructive pulmonary disease/end stage. 2. Oxygen dependent.  3. Chronic respiratory failure with increased C02. 4. Coronary artery disease.  5. History of myocardial infarction.  6. Valvular heart disease.  7. LVH. 8. Hypertension. 9. Hyperlipidemia.  10. Nicotine abuse. 11. History of migraines.  12. History of fibromyalgia. 13. Chronic pain syndrome.  14. Chronic back pain.  15. Multiple compression fractures status post kyphoplasty.   PAST SURGICAL HISTORY:    1. Multiple kyphoplasties.  2. Hysterectomy. 3. Tonsillectomy. 4. Inguinal hernia repair.  5. Removal of ovaries.  6. Breast biopsies.  7. Mastectomy. 8. Status post implants.   ALLERGIES: The patient is allergic to Lipitor, Methadone, Macrobid, Ciprofloxacin.   FAMILY HISTORY:   Positive for ovarian cancer in her mother who actually was cured but she died from heart failure. Positive for myocardial infarction in her brother who died at  the age of 63. Other family members, especially aunts, with breast cancer.   SOCIAL HISTORY: The patient is a smoker. She used to smoke 1 to 1-1/2 packs per day since the age of 38, quit in 2012, but she started smoking again and now she is smoking four to five  cigarettes a day. She denies any alcohol use. Denies any IV drug use. The only drugs that she takes are prescribed. She lives with her husband who usually takes care of her and manages her oxygen and her medications.    CURRENT MEDICATIONS: 1. Spiriva. 2. Oxycodone. 3. MS Contin.  4. Pravastatin 10 milligrams at bedtime.  5. Metoprolol 25 milligrams once daily. 6. Albuterol nebs and inhalers.  7. Nabumetone 500 milligrams p.r.n. pain. 8. Mucinex.   REVIEW OF SYSTEMS:  Twelve system review of systems is done. CONSTITUTIONAL: The patient states that she is running increase in temperature. She feels very hot and sweaty, but she has not really documented any fever. She feels very fatigued and weak and has chronic pain of her lower and middle back.  She states that she has had significant weight loss within the past year but she has been able to gain some weight back. EYES: Positive for bilateral cataracts. She denies any significant changes in her vision lately. No inflammation or redness over her eyes. ENT: Denies any tinnitus. She has mild hearing loss. Denies any nasal congestion. No epistaxis or nasal drip. RESPIRATORY: Positive for wheezing. Positive for cough. Positive for shortness of breath especially with exertion. Positive for chronic oxygen dependency. Positive for chronic obstructive pulmonary disease. CARDIOVASCULAR: Denies any significant chest pain or palpitations. No orthopnea or paroxysmal nocturnal dyspnea. Her BNP is elevated in the thousands, but the patient does not seem to be fluid overloaded. GASTROINTESTINAL: Denies any abdominal pain although she is concerned about increased gas production. She denies any melena. No constipation or diarrhea. Her last bowel movement was yesterday. Denies any change in her bowel habits. GENITOURINARY: Denies any dysuria, hematuria or increase in frequency. GYNECOLOGIC: Denies any vaginal bleeding. ENDOCRINE: Denies any polyuria, polydipsia or  polyphagia. No cold or heat intolerance in general although we mentioned chills and shivering previously. HEMATOLOGIC/LYMPHATIC: Negative for anemia. Negative for easy bruising. Negative for bleeding. Negative for swollen glands. MUSCULOSKELETAL: Positive for significant back pain for what the patient is on chronic pain medications. She follows up with Dr. Eldridge Dace at [facility] for pain management. NEUROLOGIC: Denies any significant numbness, weakness, dysarthria or headaches although she has history of migraines. She denies any headache at the moment. PSYCHIATRIC: Denies any significant ascites or depression. No bipolar disorder. No hallucinations.   PHYSICAL EXAMINATION:    GENERAL: The patient is frail with mild respiratory distress female. Mucosa is dry. She has coughing spells very often.   VITAL SIGNS: Blood pressure 149/78, heart rate 120, 02 sats are 94% on two liters nasal cannula.  HEENT:  The patient is normocephalic, atraumatic. Her pupils are equal and reactive. Her extraocular movements are intact. Anicteric sclera. Conjunctiva is pink without any signs of infection. Her ears are clear in the canals without any lesions. Her tongue is dry. There is no significant thrush. No oral lesions.   NECK: Supple. No jugular venous distention. No thyromegaly. No adenopathy. Trachea is central. No masses. No lesions.   LUNGS: Significant findings are wheezing and bilateral rhonchi, diffuse all over the lung fields.   CARDIOVASCULAR: Regular rate and rhythm without any murmurs, rubs or gallops.    ABDOMEN:  Soft, nontender, nondistended. No hepatosplenomegaly. No masses. Bowel sounds are positive.   EXTREMITIES: No edema, no cyanosis. No clubbing. Pulses +2.    SKIN: Without any significant petechiae or rash.   NEUROLOGIC: Cranial nerves 2-12 are intact. No focal exam.    PSYCHIATRIC: Mood is stable without any signs of depression. No significant anxiety.   LABORATORY, RADIOLOGICAL AND  DIAGNOSTIC DATA: Chest x-ray shows chronic changes of emphysema, atelectasis, mostly on the right mid lung and left costophrenic angles. No significant consolidation to make me suspect pneumonia. Glucose 105. BNP is 1,071, BUN 6, creatinine 0.55, sodium 136, potassium 3.9, C02 26. Prior to this admission her C02 was in the 90s. Liver function tests within normal limits. Cardiac enzymes are negative. Hemoglobin 13.9, white count 7.4. She has normal platelets of 275. EKG shows mostly sinus tachycardia with LVH. No significant ST or T wave depression or inversion.   ASSESSMENT AND PLAN:    1. Chronic obstructive pulmonary disease with acute exacerbation. The patient is having mild respiratory distress with use of some accessory muscles. At this moment she is keeping her oxygen saturation okay with oxygen via nasal cannula. I do not think she requires any more invasive ventilation either Bi-PAP or HFNC will be good. At this moment the patient is stable to go to the regular floor.  2. Since her exacerbation has been going on for long-acting, we have to encourage smoking cessation. We had a discussion over ten minutes, almost fifteen minutes, about quitting smoking and about effects of the nicotine tobacco over the lungs, but also we went up to peripheral effects including bone and arteries.  3. The patient, again, is going to put on Levaquin. She has been taking Avelox but that is not formulary here in the hospital, plus Levaquin might be at least a good change for her. She is going to take nebs with Albuterol which I am going to increase the frequency to every four hours. As an outpatient, she is on Spiriva, but since she is going to be taken more often Albuterol/Atrovent, I am going to hold the Spiriva since she is taking now Ipratropium. I recommend to start back again on Spiriva once the patient is discharged.  4. Chest x-ray to be followed in the next day to make sure we are not missing a new pneumonia or any  acute infiltrates that were not showing.   5. We are going to start the patient on steroids. At this moment we are going to do 60 milligrams every eight hours since the patient is having significant respiratory distress and this will be tapered down by the floor hospitalist once the patient is more stable.  6. Mild dehydration. The patient to be put on gentle hydration IV fluids.  7. Tachycardia. The patient could have tachycardia due to her nebulizers. She states it always happens and that she gets very jittery although we have to consider the possibility of the patient having a pulmonary emboli. At this moment I think it would be reasonable to wait and see her in the day if she remains tachycardiac. If her heart rate lowers during the time that she is not getting her nebs, that would be very reassuring.  8. The patient also has increased BNP, but no significant findings of fluid overload for what I think it is safe to treat with diuretics at the moment.  9. History of non small cell carcinoma. At this moment it has been in remission for what there are  no requiring treatments.  10. Chronic pain. The patient has history of pathologic compression fractures for which she has significant back pain. She is taking OxyContin and Morphine and they seem to be helping. The patient is also osteoporotic and she is due to have her IV dose of alendronate within this month. 11. Hypertension. Continue Metoprolol. Blood pressure is mildly elevated, but overall well controlled.  12. Dyslipidemia. Continue statin.  13. Smoking cessation. 14. History of migraines. They are well controlled.  At this moment we are going to use her own pain medications to control this.  15. Deep venous thrombosis prophylaxis. We are going to start the patient on Lovenox.   CODE STATUS: The patient is FULL CODE right now, but after a long conversation, the patient is thinking about changing to DO NOT RESUSCITATE for what she needs to talk to  her significant other to make sure that he agrees.   TIME SPENT: 42 minutes.   ____________________________ Humeston Sink, MD rsg:ap D: 12/08/2011 07:56:35 ET T: 12/08/2011 08:30:24 ET JOB#: [id number]  cc: Manchester Sink, MD, <Dictator> Dhrithi Riche America Brown MD ELECTRONICALLY SIGNED 12/30/2011 13:04

## 2014-09-04 NOTE — Op Note (Signed)
PATIENT NAME:  Amber Harrison, Amber Harrison MR#:  [id number] DATE OF BIRTH:  March 14, 1941  DATE OF PROCEDURE:  05/27/2012  PREOPERATIVE DIAGNOSIS: Cataract, left eye.   POSTOPERATIVE DIAGNOSIS: Cataract, left eye.   PROCEDURE PERFORMED: Extracapsular cataract extraction using phacoemulsification with placement of an Alcon SN6CWS, 21.0-diopter, posterior chamber lens, serial #[id number].   SURGEON: Loura Back Ugochi Henzler, MD  ANESTHESIA: 4% lidocaine and 0.75% Marcaine in a 50:50 mixture with 10 units/mL of Hylenex added, given as a peribulbar.   ANESTHESIOLOGIST: Dr. Benjamine Mola   COMPLICATIONS: None.   ESTIMATED BLOOD LOSS: Less than 1 mL.   DESCRIPTION OF PROCEDURE: The patient was brought to the operating room and given a peribulbar block. She was prepped and draped in the usual fashion. The vertical rectus muscles were imbricated using 5-0 silk sutures, bridle sutures. A limbal peritomy was carried out at 12 o'clock for 1 clock hour. Hemostasis was obtained with cautery, and a partial thickness scleral groove was made at the posterior surgical limbus and dissected anteriorly into clear cornea. The anterior chamber was entered superotemporally through clear cornea with a paracentesis knife. Air was used to replace the aqueous, and then [employer] was used to paint the anterior capsule. The air was replaced with DisCoVisc, and the anterior chamber was entered through the lamellar dissection with a 2.6 mm keratome. Continuous tear circular capsulorrhexis was carried out without difficulty. Hydrodissection was used to loosen the nucleus, and phacoemulsification was then carried out in a divide and conquer technique. Ultrasound time was 2 minutes and 56.9 seconds, average power of 25.7 and a CDE of 72.49. Irrigation and aspiration was used to remove the residual cortex. The capsular bag was then inflated with DisCoVisc, and the intraocular lens was inserted in the capsular bag using direct visualization.  Irrigation-aspiration was used to remove the residual DisCoVisc. The wound was inflated with balanced salt, and Miostat was injected through the paracentesis tract. A tenth of a mL of cefuroxime was injected under the edge of the anterior capsule. The wound was checked for leaks and none were found. The conjunctiva was closed with cautery. The bridle sutures were removed. Two drops of Vigamox were placed on the eye. A shield was placed on the eye. The patient was discharged to the recovery room in good condition.   ____________________________ Loura Back Nyjah Denio, MD sad:cb D: 05/27/2012 13:08:58 ET T: 05/27/2012 13:26:54 ET JOB#: [id number]  cc: Remo Lipps A. Cortavious Nix, MD, <Dictator> Martie Lee MD ELECTRONICALLY SIGNED 06/03/2012 13:38

## 2014-09-06 NOTE — H&P (Signed)
PATIENT NAME:  Amber Harrison, CAPPELLETTI MR#:  [id number] DATE OF BIRTH:  1941-04-27  DATE OF ADMISSION:  11/10/2011  CHIEF COMPLAINT: Pneumonia.   HISTORY OF PRESENT ILLNESS: 74 year old with history of end-stage chronic obstructive pulmonary disease and non-small cell lung cancer has been in remission, who has had increasing cough, shortness of breath over the last two weeks. Was seen by her primary care physician at [facility], Dr. Quay Burow, and treated with Levaquin. Followed up earlier this week with her pulmonologist, Dr. Raul Del, and given IM steroids in the office and continued on Levaquin. She returns today on Friday not improving and more short of breath. She is O2 dependent and sating 91% on 4 liters. She has been having decreased appetite, night sweats with low-grade fevers. She is having tightness in the chest and some mild abdominal pain.   She has a history of non-small cell malignancy with chemotherapy and radiation treatments in the past, now in remission. Has been followed by [facility]. Earlier this week her chest x-ray showed new density in the right upper lobe. She has history of osteoporosis with multiple compression fractures. [facility]. Also been seen by rheumatology. She is on chronic pain medicine, MS Contin and oxycodone.   PAST MEDICAL HISTORY:  1. Chronic obstructive pulmonary disease, end-stage. O2 dependent. Prior pneumonia.  2. Non-small cell cancer with chemotherapy and radiation treatment.  3. Coronary artery disease, history of myocardial infarction and valvular heart disease.  4. History of hypertension.  5. Osteoporosis.  6. Fibromyalgia.  7. Hyperlipidemia.  8. Nicotine abuse.  9. History of migraines.  10. Multiple compression fractures with kyphoplasty.   PAST SURGICAL HISTORY:  1. Hysterectomy. 2. Tonsillectomy. 3. Inguinal repair on the right.  4. Ovary removal in [facility] 2011.  5. Breast biopsies with mastectomies with subsequent implants that  also have been removed.  6. Kyphoplasty, multiple.   ALLERGIES: Lipitor, Macrobid, methadone and Cipro; all unspecified reactions.   CURRENT MEDICATIONS:  1. Spiriva 1 puff daily.  2. Pravastatin 10 mg at bedtime.  3. Oxycodone 10 mg every six hours p.r.n.  4. MS Contin 15 mg every eight hours.  5. Metoprolol 25 mg extended release once daily.  6. Albuterol sulfate nebulizers every six hours p.r.n.  7. Nabumetone 500 mg b.i.d. p.r.n. for pain.  8. Mucinex p.r.n.   FAMILY HISTORY: Mother had ovarian cancer, but died in her late 39s of heart failure. Brother died of myocardial infarction at 70. One of her aunts had breast cancer.   SOCIAL HISTORY: Smoking history of 1 to 1.5 packs per day since age 32, quit in 2012. Restarted smoking after diagnosis of lung cancer.   REVIEW OF SYSTEMS: As per history of present illness. She denies any blurred vision, double vision. She has dry mouth, upper and lower dentures. No ear pain. She has had occasional nausea and epigastric discomfort. Bowels moved this morning. No urinary symptoms. She denies any rashes. She does have some history of anxiety.   PHYSICAL EXAMINATION:  GENERAL: Elderly female in mild distress.   VITAL SIGNS: Weight 99, blood pressure 110/60, temperature 97.5, pulse 110, oxygen saturation 91% on 4 liters.   HEENT: Head is normocephalic, atraumatic. Pupils equal, round, reactive to light. Extraocular muscles intact. Sclera nonicteric. Noninjected conjunctiva. Ear exam shows clear canals with normal landmarks and normal tympanic membrane. Her oropharynx is dentures upper and lower. Erythematous posterior pharynx with no lesions and dry mucosa.   NECK: Supple. No adenopathy. No thyromegaly.  LUNGS: Bilateral rhonchi, wheezing, inspiratory and expiratory.   HEART: Regular rate and rhythm. No murmurs noted.   ABDOMEN: Soft. Epigastric tenderness with positive bowel sounds.   EXTREMITIES: No edema. Distal pulses are 2+ and  symmetric.   SKIN: Warm and dry.   NEUROLOGIC: Nonfocal.   ASSESSMENT AND PLAN:  1. Chronic obstructive pulmonary disease flare, pneumonia, new right upper lobe density. Has failed outpatient treatment. I will admit for IV antibiotics. Rocephin 1 gram IV q.24h. Zithromax 500 q.24h. IV Solu-Medrol 60 mg IV q.8 hours. DuoNebs every six hours and q.3 p.r.n. Repeat chest x-ray in the a.m. She will have 02 at 3 liters to maintain sats 80 to 90. Obtain labs, CBC, MET-C, blood cultures x1, sputum for Gram stain and C and S. Also IV normal saline at 50 mL/h.   2. Osteoporosis, history of compression fractures, chronic back pain. Continue MS Contin and oxycodone. Give MiraLax daily as well as milk of magnesia p.r.n.   3. Hypertension. Continue metoprolol.  4. History of non-small cell lung cancer. Repeat chest x-ray tomorrow.   ____________________________ Marily Lente. Latonda Larrivee, PA-C rjt:cms D: 11/10/2011 12:21:54 ET T: 11/10/2011 13:09:04 ET JOB#: [id number]  cc: Marily Lente. Lolita Lenz, <Dictator> Leonard Downing PA ELECTRONICALLY SIGNED 11/17/2011 16:29

## 2014-09-06 NOTE — Op Note (Signed)
PATIENT NAME:  Amber Harrison, Amber Harrison MR#:  [id number] DATE OF BIRTH:  29-Apr-1941  DATE OF PROCEDURE:  05/11/2011  PREOPERATIVE DIAGNOSIS: Symptomatic compression fractures at T12, L1, L2, and L4.   POSTOPERATIVE DIAGNOSIS: Symptomatic compression fractures at T12, L1, L2, and L4.   PROCEDURES:  1. Kyphoplasty with biopsy and injection of PMMA cement, T12.  2. Kyphoplasty with biopsy and injection of PMMA cement, L1. 3. Kyphoplasty with biopsy and injection of PMMA cement L2. 4. Kyphoplasty with biopsy and injection of PMMA cement L4.  5. Radiologic interpretation of fluoroscopically placed trocars for kyphoplasty and biopsy of T12, L1, L2, and L4.   SURGEON: January Bergthold C. Chele Cornell, M.D.   ASSISTANT: None.   ANESTHESIA: MAC.   ESTIMATED BLOOD LOSS: Minimal.   COMPLICATIONS: None.   SPECIMENS SENT: Tissue from T12, L1, L2, and L4.   BRIEF CLINICAL NOTE AND PATHOLOGY: The patient had severe back pain unresponsive to conservative treatment.  The initial plan was to proceed with kyphoplasty at T12, L2, and L4. The patient, however, had developed increasing pain, felt a pop in her back, and x-rays showed further compression at L1 and L2.  We elected to proceed with all four levels due to the patient's underlying medical issues and the concern about anesthetic. Good bone specimen was obtained at each level.   PROCEDURE: Preop antibiotics, adequate MAC anesthesia, prone position, all prominences well padded. Routine prepping and draping. Appropriate time-out was called. AP and lateral fluoroscopic views were used to appropriately isolate the pedicles and identify the vertebral bodies.   The vertebral bodies were each instrumented from the right side beginning with T12 and L1. These were instrumented in a routine fashion. The trocar was advanced through the pedicle into the vertebral body. A biopsy was obtained. The balloon was then inserted and inflated. Balloon was  removed and cement was injected when it was  of the appropriate consistency. There was slight anterior extravasation of small ribbon of  cement.   The cannulas were cleaned and removed. Attention was then turned to L2 and L4 where the same procedure was performed. Cement was injected, filling the vertebral bodies nicely, again cannulas were cleaned and removed. Fluoroscopic guidance was used throughout the procedure. Biopsy was obtained at each level.   Incisions were closed with Monocryl. Band-Aids were applied. The patient was awakened and taken to the postanesthesia care unit having tolerated the procedure well.    ____________________________ [name]. Mauri Pole, MD jcc:bjt D: 05/11/2011 22:03:20 ET T: 05/12/2011 09:47:22 ET JOB#: [id number]  cc: [name]. Mauri Pole, MD, <Dictator> [name] [name] MD ELECTRONICALLY SIGNED 06/01/2011 16:03

## 2014-09-06 NOTE — Discharge Summary (Signed)
PATIENT NAME:  Amber Harrison, Amber Harrison MR#:  [id number] DATE OF BIRTH:  [birth year]  DATE OF ADMISSION:  11/10/2011 DATE OF DISCHARGE:  11/17/2011  REASON FOR ADMISSION: Pneumonia.   HISTORY OF PRESENT ILLNESS: Please see the dictated History and Physical done by Dr. Nani Gasser on 11/10/2011.   PAST MEDICAL HISTORY:  1. Chronic obstructive pulmonary disease.  2. Chronic respiratory failure with oxygen dependence.  3. History of pneumonia.  4. History of non-small-cell lung cancer, status post chemotherapy and radiation therapy.  5. 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  6. Valvular heart disease.  7. Benign hypertension.  8. Osteoporosis.  9. Fibromyalgia.  10. Hyperlipidemia.  11. Nicotine abuse.  12. History of migraines.  13. Multiple compression fractures with kyphoplasty.  14. Status post hysterectomy.   MEDICATIONS ON ADMISSION: Please see admission note.   ALLERGIES: Lipitor,  Macrobid, methadone, and Cipro.   SOCIAL HISTORY, FAMILY HISTORY, AND REVIEW OF SYSTEMS:  As per admission note.   PHYSICAL EXAM: The patient was in moderate respiratory distress.  Vital signs were remarkable for a blood pressure of 110/60 with a heart rate of 110 and a saturation of 91% on 4 liters. HEENT exam was unremarkable except for dry mucosa. Neck was supple without jugular venous distention. Lungs revealed scattered wheezes and rhonchi with inspiratory and expiratory wheezes. Cardiac exam revealed a regular rate and rhythm. Normal S1 and S2.  Abdomen was soft with some mild epigastric tenderness. Normoactive bowel sounds. Extremities without edema. Neurologic exam was grossly nonfocal.   HOSPITAL COURSE: The patient was admitted with chronic obstructive pulmonary disease exacerbation with acute on chronic respiratory failure and presumed pneumonia. She was started on IV steroids with IV antibiotics with DuoNeb SVNs. She was seen in consultation by pulmonology who recommended  conservative therapy. Chest x-rays were consistent with pneumonia. Her serial x-rays improved over time. She was followed by Dr. Raul Del in the hospital. She was switched to oral antibiotics and oral steroids and continued to improve. She remained oxygen dependent. She was seen in consultation by physical therapy. She ambulated in the room without difficulty. Home Health was recommended.  By 11/17/2011, the patient was stable and ready for discharge.   DISCHARGE DIAGNOSES:  1. Acute on chronic respiratory failure.  2. Chronic obstructive pulmonary disease exacerbation.  3. Pneumonia.  4. Fibromyalgia.  5. Atherosclerotic cardiovascular disease.   DISCHARGE MEDICATIONS:  1. Nitrostat 0.4 mg sublingually p.r.n. chest pain.  2. Spiriva 1 capsule inhaled daily.  3. Oxygen at 2 liters per minute per nasal cannula.  4. Combivent respimat 1 puff q.i.d.  5. Pravachol 10 mg p.o. at bedtime.  6. Aspirin 81 mg p.o. daily.  7. Zofran 4 mg p.o. q. 8 hours p.r.n. nausea and vomiting.  8. Lasix 20 mg p.o. daily as needed for swelling.  9. Mucinex 600 mg p.o. b.i.d.  10. Protonix 40 mg p.o. daily.  11. Wellbutrin 75 mg p.o. twice a day.  12. Oxycodone 10 mg 1 to 2 p.o. every six hours p.r.n. pain.  13. MS Contin 15 mg p.o. t.i.d. times daily.  14. DuoNeb SVNs q.i.d.  15. Nabumetone 500 mg p.o. b.i.d.  16. Ceftin 500 mg p.o. b.i.d. times one week.  17. Zithromax 250 mg p.o. daily times five days.  18. Prednisone taper as directed.  19. Toprol-XL 50 mg p.o. daily.   FOLLOW-UP PLANS AND APPOINTMENTS: The patient will be followed by Home Health. She was discharged on 2 liters of oxygen with a regular diet.  She will follow up with Dr. Raul Del next week, sooner if needed.    ____________________________ Leonie Douglas Doy Hutching, MD jds:bjt D: 11/17/2011 08:19:34 ET T: 11/18/2011 14:52:10 ET JOB#: [id number]  cc: Leonie Douglas. Doy Hutching, MD, <Dictator> Maniyah Moller Lennice Sites MD ELECTRONICALLY SIGNED 11/19/2011 21:29

## 2014-09-19 ENCOUNTER — Other Ambulatory Visit: Payer: Self-pay

## 2014-09-19 DIAGNOSIS — C3491 Malignant neoplasm of unspecified part of right bronchus or lung: Secondary | ICD-10-CM

## 2014-09-19 DIAGNOSIS — D751 Secondary polycythemia: Secondary | ICD-10-CM

## 2014-09-23 ENCOUNTER — Ambulatory Visit: Payer: Self-pay | Admitting: Internal Medicine

## 2014-09-23 ENCOUNTER — Other Ambulatory Visit: Payer: Self-pay

## 2014-10-04 ENCOUNTER — Emergency Department

## 2014-10-04 ENCOUNTER — Inpatient Hospital Stay
Admission: EM | Admit: 2014-10-04 | Discharge: 2014-10-10 | DRG: 872 | Disposition: A | Discharge status: Home / Self Care | Attending: Internal Medicine | Admitting: Internal Medicine

## 2014-10-04 ENCOUNTER — Encounter: Payer: Self-pay | Admitting: Emergency Medicine

## 2014-10-04 DIAGNOSIS — Z66 Do not resuscitate: Secondary | ICD-10-CM | POA: Diagnosis present

## 2014-10-04 DIAGNOSIS — Z9981 Dependence on supplemental oxygen: Secondary | ICD-10-CM

## 2014-10-04 DIAGNOSIS — A419 Sepsis, unspecified organism: Principal | ICD-10-CM | POA: Diagnosis present

## 2014-10-04 DIAGNOSIS — G43909 Migraine, unspecified, not intractable, without status migrainosus: Secondary | ICD-10-CM | POA: Diagnosis present

## 2014-10-04 DIAGNOSIS — L03315 Cellulitis of perineum: Secondary | ICD-10-CM | POA: Diagnosis present

## 2014-10-04 DIAGNOSIS — K5903 Drug induced constipation: Secondary | ICD-10-CM | POA: Diagnosis present

## 2014-10-04 DIAGNOSIS — Z853 Personal history of malignant neoplasm of breast: Secondary | ICD-10-CM

## 2014-10-04 DIAGNOSIS — Z79899 Other long term (current) drug therapy: Secondary | ICD-10-CM | POA: Diagnosis not present

## 2014-10-04 DIAGNOSIS — I252 Old myocardial infarction: Secondary | ICD-10-CM | POA: Diagnosis not present

## 2014-10-04 DIAGNOSIS — Z7952 Long term (current) use of systemic steroids: Secondary | ICD-10-CM | POA: Diagnosis not present

## 2014-10-04 DIAGNOSIS — J449 Chronic obstructive pulmonary disease, unspecified: Secondary | ICD-10-CM | POA: Diagnosis present

## 2014-10-04 DIAGNOSIS — Z881 Allergy status to other antibiotic agents status: Secondary | ICD-10-CM

## 2014-10-04 DIAGNOSIS — L02215 Cutaneous abscess of perineum: Secondary | ICD-10-CM | POA: Diagnosis present

## 2014-10-04 DIAGNOSIS — I10 Essential (primary) hypertension: Secondary | ICD-10-CM | POA: Diagnosis present

## 2014-10-04 DIAGNOSIS — G8929 Other chronic pain: Secondary | ICD-10-CM | POA: Diagnosis present

## 2014-10-04 DIAGNOSIS — Z7982 Long term (current) use of aspirin: Secondary | ICD-10-CM | POA: Diagnosis not present

## 2014-10-04 DIAGNOSIS — Z79891 Long term (current) use of opiate analgesic: Secondary | ICD-10-CM | POA: Diagnosis not present

## 2014-10-04 DIAGNOSIS — K567 Ileus, unspecified: Secondary | ICD-10-CM

## 2014-10-04 DIAGNOSIS — E780 Pure hypercholesterolemia: Secondary | ICD-10-CM | POA: Diagnosis present

## 2014-10-04 DIAGNOSIS — I2510 Atherosclerotic heart disease of native coronary artery without angina pectoris: Secondary | ICD-10-CM | POA: Diagnosis present

## 2014-10-04 DIAGNOSIS — T402X5A Adverse effect of other opioids, initial encounter: Secondary | ICD-10-CM | POA: Diagnosis present

## 2014-10-04 DIAGNOSIS — F17210 Nicotine dependence, cigarettes, uncomplicated: Secondary | ICD-10-CM | POA: Diagnosis present

## 2014-10-04 DIAGNOSIS — E871 Hypo-osmolality and hyponatremia: Secondary | ICD-10-CM | POA: Diagnosis present

## 2014-10-04 DIAGNOSIS — R339 Retention of urine, unspecified: Secondary | ICD-10-CM | POA: Diagnosis present

## 2014-10-04 DIAGNOSIS — Z7951 Long term (current) use of inhaled steroids: Secondary | ICD-10-CM

## 2014-10-04 DIAGNOSIS — Z85118 Personal history of other malignant neoplasm of bronchus and lung: Secondary | ICD-10-CM | POA: Diagnosis not present

## 2014-10-04 DIAGNOSIS — M545 Low back pain, unspecified: Secondary | ICD-10-CM | POA: Diagnosis present

## 2014-10-04 DIAGNOSIS — K5909 Other constipation: Secondary | ICD-10-CM | POA: Diagnosis present

## 2014-10-04 DIAGNOSIS — M797 Fibromyalgia: Secondary | ICD-10-CM | POA: Diagnosis present

## 2014-10-04 DIAGNOSIS — E785 Hyperlipidemia, unspecified: Secondary | ICD-10-CM | POA: Diagnosis present

## 2014-10-04 HISTORY — DX: Fibromyalgia: M79.7

## 2014-10-04 HISTORY — DX: Pure hypercholesterolemia, unspecified: E78.00

## 2014-10-04 HISTORY — DX: Dorsalgia, unspecified: M54.9

## 2014-10-04 HISTORY — DX: Migraine, unspecified, not intractable, without status migrainosus: G43.909

## 2014-10-04 HISTORY — DX: Essential (primary) hypertension: I10

## 2014-10-04 HISTORY — DX: Unspecified fracture of unspecified lumbar vertebra, initial encounter for closed fracture: S32.009A

## 2014-10-04 HISTORY — DX: Chronic obstructive pulmonary disease, unspecified: J44.9

## 2014-10-04 HISTORY — DX: Acute myocardial infarction, unspecified: I21.9

## 2014-10-04 HISTORY — DX: Emphysema, unspecified: J43.9

## 2014-10-04 HISTORY — DX: Malignant (primary) neoplasm, unspecified: C80.1

## 2014-10-04 HISTORY — DX: Other chronic pain: G89.29

## 2014-10-04 LAB — CBC
HEMATOCRIT: 42.4 % (ref 35.0–47.0)
Hemoglobin: 14.2 g/dL (ref 12.0–16.0)
MCH: 33.8 pg (ref 26.0–34.0)
MCHC: 33.5 g/dL (ref 32.0–36.0)
MCV: 100.8 fL — ABNORMAL HIGH (ref 80.0–100.0)
Platelets: 273 10*3/uL (ref 150–440)
RBC: 4.2 MIL/uL (ref 3.80–5.20)
RDW: 13.6 % (ref 11.5–14.5)
WBC: 14.5 10*3/uL — ABNORMAL HIGH (ref 3.6–11.0)

## 2014-10-04 LAB — URINALYSIS COMPLETE WITH MICROSCOPIC (ARMC ONLY)
BILIRUBIN URINE: NEGATIVE
Bacteria, UA: NONE SEEN
GLUCOSE, UA: NEGATIVE mg/dL
Hgb urine dipstick: NEGATIVE
Ketones, ur: NEGATIVE mg/dL
LEUKOCYTES UA: NEGATIVE
NITRITE: NEGATIVE
PROTEIN: NEGATIVE mg/dL
Specific Gravity, Urine: 1.006 (ref 1.005–1.030)
Squamous Epithelial / LPF: NONE SEEN
pH: 6 (ref 5.0–8.0)

## 2014-10-04 LAB — COMPREHENSIVE METABOLIC PANEL
ALK PHOS: 87 U/L (ref 38–126)
ALT: 19 U/L (ref 14–54)
ANION GAP: 7 (ref 5–15)
AST: 32 U/L (ref 15–41)
Albumin: 3 g/dL — ABNORMAL LOW (ref 3.5–5.0)
BUN: 8 mg/dL (ref 6–20)
CHLORIDE: 91 mmol/L — AB (ref 101–111)
CO2: 35 mmol/L — AB (ref 22–32)
Calcium: 8.3 mg/dL — ABNORMAL LOW (ref 8.9–10.3)
Creatinine, Ser: 0.57 mg/dL (ref 0.44–1.00)
Glucose, Bld: 91 mg/dL (ref 65–99)
POTASSIUM: 3.5 mmol/L (ref 3.5–5.1)
Sodium: 133 mmol/L — ABNORMAL LOW (ref 135–145)
Total Bilirubin: 0.9 mg/dL (ref 0.3–1.2)
Total Protein: 5.8 g/dL — ABNORMAL LOW (ref 6.5–8.1)

## 2014-10-04 MED ORDER — SODIUM CHLORIDE 0.9 % IV SOLN
INTRAVENOUS | Status: DC
  Administered 2014-10-04 – 2014-10-06 (×4): via INTRAVENOUS

## 2014-10-04 MED ORDER — VANCOMYCIN HCL IN DEXTROSE 1-5 GM/200ML-% IV SOLN
INTRAVENOUS | Status: AC
  Filled 2014-10-04: qty 200

## 2014-10-04 MED ORDER — SENNA 8.6 MG PO TABS
1.0000 | ORAL_TABLET | Freq: Two times a day (BID) | ORAL | Status: DC
  Administered 2014-10-04 – 2014-10-10 (×9): 8.6 mg via ORAL
  Filled 2014-10-04 (×12): qty 1

## 2014-10-04 MED ORDER — ONDANSETRON HCL 4 MG/2ML IJ SOLN
4.0000 mg | Freq: Once | INTRAMUSCULAR | Status: AC
  Administered 2014-10-04: 4 mg via INTRAVENOUS

## 2014-10-04 MED ORDER — MORPHINE SULFATE 4 MG/ML IJ SOLN
4.0000 mg | Freq: Once | INTRAMUSCULAR | Status: AC
  Administered 2014-10-04: 4 mg via INTRAVENOUS

## 2014-10-04 MED ORDER — ONDANSETRON HCL 4 MG PO TABS
4.0000 mg | ORAL_TABLET | Freq: Four times a day (QID) | ORAL | Status: DC | PRN
  Administered 2014-10-09: 4 mg via ORAL
  Filled 2014-10-04: qty 1

## 2014-10-04 MED ORDER — ACETAMINOPHEN 325 MG PO TABS
650.0000 mg | ORAL_TABLET | Freq: Four times a day (QID) | ORAL | Status: DC | PRN
  Administered 2014-10-09: 650 mg via ORAL
  Filled 2014-10-04: qty 2

## 2014-10-04 MED ORDER — PIPERACILLIN-TAZOBACTAM 3.375 G IVPB
INTRAVENOUS | Status: AC
  Filled 2014-10-04: qty 50

## 2014-10-04 MED ORDER — MORPHINE SULFATE 2 MG/ML IJ SOLN
2.0000 mg | INTRAMUSCULAR | Status: DC | PRN
  Administered 2014-10-04 – 2014-10-08 (×13): 2 mg via INTRAVENOUS
  Filled 2014-10-04 (×12): qty 1

## 2014-10-04 MED ORDER — DOCUSATE SODIUM 100 MG PO CAPS
100.0000 mg | ORAL_CAPSULE | Freq: Two times a day (BID) | ORAL | Status: DC
  Administered 2014-10-04 – 2014-10-09 (×6): 100 mg via ORAL
  Filled 2014-10-04 (×11): qty 1

## 2014-10-04 MED ORDER — MAGNESIUM CITRATE PO SOLN
ORAL | Status: AC
  Administered 2014-10-04: 296 mL
  Filled 2014-10-04: qty 296

## 2014-10-04 MED ORDER — ONDANSETRON HCL 4 MG/2ML IJ SOLN
INTRAMUSCULAR | Status: AC
  Filled 2014-10-04: qty 2

## 2014-10-04 MED ORDER — ONDANSETRON HCL 4 MG/2ML IJ SOLN
4.0000 mg | Freq: Four times a day (QID) | INTRAMUSCULAR | Status: DC | PRN
  Administered 2014-10-07 – 2014-10-10 (×4): 4 mg via INTRAVENOUS
  Filled 2014-10-04 (×5): qty 2

## 2014-10-04 MED ORDER — HEPARIN SODIUM (PORCINE) 5000 UNIT/ML IJ SOLN
5000.0000 [IU] | Freq: Three times a day (TID) | INTRAMUSCULAR | Status: DC
  Administered 2014-10-04 – 2014-10-08 (×11): 5000 [IU] via SUBCUTANEOUS
  Filled 2014-10-04 (×11): qty 1

## 2014-10-04 MED ORDER — MORPHINE SULFATE 4 MG/ML IJ SOLN
INTRAMUSCULAR | Status: AC
  Filled 2014-10-04: qty 1

## 2014-10-04 MED ORDER — MAGNESIUM HYDROXIDE 400 MG/5ML PO SUSP
30.0000 mL | Freq: Every day | ORAL | Status: DC | PRN
  Filled 2014-10-04: qty 30

## 2014-10-04 MED ORDER — MORPHINE SULFATE 2 MG/ML IJ SOLN
INTRAMUSCULAR | Status: AC
  Filled 2014-10-04: qty 1

## 2014-10-04 MED ORDER — ACETAMINOPHEN 650 MG RE SUPP: 650.0000 mg | Freq: Four times a day (QID) | RECTAL | Status: DC | PRN

## 2014-10-04 MED ORDER — IOHEXOL 300 MG/ML  SOLN
75.0000 mL | Freq: Once | INTRAMUSCULAR | Status: AC | PRN
  Administered 2014-10-04: 75 mL via INTRAVENOUS

## 2014-10-04 MED ORDER — IOHEXOL 240 MG/ML SOLN
25.0000 mL | INTRAMUSCULAR | Status: AC
  Administered 2014-10-04: 25 mL via ORAL

## 2014-10-04 MED ORDER — VANCOMYCIN HCL IN DEXTROSE 1-5 GM/200ML-% IV SOLN
1000.0000 mg | Freq: Once | INTRAVENOUS | Status: AC
  Administered 2014-10-04: 1000 mg via INTRAVENOUS

## 2014-10-04 MED ORDER — PIPERACILLIN-TAZOBACTAM 3.375 G IVPB
3.3750 g | Freq: Once | INTRAVENOUS | Status: AC
  Administered 2014-10-04: 3.375 g via INTRAVENOUS

## 2014-10-04 NOTE — ED Provider Notes (Signed)
[facility] Emergency Department Provider Note  ____________________________________________  Time seen: 3:45 PM  I have reviewed the triage vital signs and the nursing notes.   HISTORY  Chief Complaint Constipation and Vaginal Pain      HPI Amber Harrison is a 74 y.o. female presents with constipation 10 days. Patient also admits to 8 out of 10 left lower quadrant pain 2 days. In addition patient admits to tender swollen area adjacent to the vaginal opening. Regarding constipation patient has used 2 enemas at home administered by hospice RN without results.    Past Medical History  Diagnosis Date  . COPD (chronic obstructive pulmonary disease)   . Fibromyalgia   . Emphysema of lung   . Hypertension   . High cholesterol   . Heart attack 2013  . Cancer 2013    lung, breast    There are no active problems to display for this patient.   Past Surgical History  Procedure Laterality Date  . Tonsillectomy    . Eye surgery Bilateral   . Mastectomy Bilateral   . Abdominal hysterectomy      total    No current outpatient prescriptions on file.  Allergies Macrobid  No family history on file.  Social History History  Substance Use Topics  . Smoking status: Current Every Day Smoker -- 0.50 packs/day    Types: Cigarettes  . Smokeless tobacco: Not on file  . Alcohol Use: No    Review of Systems  Constitutional: Negative for fever. Eyes: Negative for visual changes. ENT: Negative for sore throat. Cardiovascular: Negative for chest pain. Respiratory: Negative for shortness of breath. Gastrointestinal: Positive for abdominal pain. No vomiting and diarrhea. Genitourinary: Negative for dysuria. Tender swollen area adjacent to the vaginal opening Musculoskeletal: Negative for back pain. Skin: Negative for rash. Neurological: Negative for headaches, focal weakness or numbness.   10-point ROS otherwise  negative.  ____________________________________________   PHYSICAL EXAM:  VITAL SIGNS: ED Triage Vitals  Enc Vitals Group     BP 10/04/14 1524 120/74 mmHg     Pulse Rate 10/04/14 1524 96     Resp 10/04/14 1524 24     Temp 10/04/14 1524 98.1 F (36.7 C)     Temp Source 10/04/14 1524 Oral     SpO2 10/04/14 1524 96 %     Weight 10/04/14 1524 92 lb (41.731 kg)     Height 10/04/14 1524 5' (1.524 m)     Head Cir --      Peak Flow --      Pain Score 10/04/14 1529 6     Pain Loc --      Pain Edu? --      Excl. in [place]? --      Constitutional: Alert and oriented. Well appearing and in no distress. Eyes: Conjunctivae are normal. PERRL. Normal extraocular movements. ENT   Head: Normocephalic and atraumatic.   Nose: No congestion/rhinnorhea.   Mouth/Throat: Mucous membranes are moist.   Neck: No stridor. Cardiovascular: Normal rate, regular rhythm. Normal and symmetric distal pulses are present in all extremities. No murmurs, rubs, or gallops. Respiratory: Normal respiratory effort without tachypnea nor retractions. Breath sounds are clear and equal bilaterally. No wheezes/rales/rhonchi. Gastrointestinal: Soft and nontender. No distention. There is no CVA tenderness. Genitourinary: Erythematous indurated swollen area adjacent to the vaginal introitus on the left. Musculoskeletal: Nontender with normal range of motion in all extremities. No joint effusions.  No lower extremity tenderness nor edema. Neurologic:  Normal speech  and language. No gross focal neurologic deficits are appreciated. Speech is normal.  Skin:  Skin is warm, dry and intact. No rash noted. Psychiatric: Mood and affect are normal. Speech and behavior are normal. Patient exhibits appropriate insight and judgment.  ____________________________________________    LABS (pertinent positives/negatives)  Labs Reviewed  CBC - Abnormal; Notable for the following:    WBC 14.5 (*)    MCV 100.8 (*)    All  other components within normal limits  COMPREHENSIVE METABOLIC PANEL - Abnormal; Notable for the following:    Sodium 133 (*)    Chloride 91 (*)    CO2 35 (*)    Calcium 8.3 (*)    Total Protein 5.8 (*)    Albumin 3.0 (*)    All other components within normal limits  URINALYSIS COMPLETEWITH MICROSCOPIC ([facility])  - Abnormal; Notable for the following:    Color, Urine YELLOW (*)    APPearance CLEAR (*)    All other components within normal limits     ____________________________________________    RADIOLOGY  CT scan of the abdomen and pelvis revealed large volume stool. Left 3 x 3 cm swollen area and perineum.  ____________________________________________     INITIAL IMPRESSION / ASSESSMENT AND PLAN / ED COURSE  Pertinent labs & imaging results that were available during my care of the patient were reviewed by me and considered in my medical decision making (see chart for details).  History of physical exam consistent with cellulitis of the perineum. And constipation patient will receive a soapsuds enema IV vancomycin and Zosyn. Patient discussed with Dr. Boone Master none for hospital admission  ____________________________________________   FINAL CLINICAL IMPRESSION(S) / ED DIAGNOSES  Final diagnoses:  Cellulitis of perineum      Gregor Hams, MD 10/04/14 2019

## 2014-10-04 NOTE — H&P (Signed)
[facility] [place] NAME: Amber Harrison    MR#:  [id number]  DATE OF BIRTH:  13-Jun-1940   DATE OF ADMISSION:  10/04/2014  PRIMARY CARE PHYSICIAN: No primary care provider on file.   REQUESTING/REFERRING PHYSICIAN: Owens Harrison  CHIEF COMPLAINT:   Chief Complaint  Patient presents with  . Constipation  . Vaginal Pain    HISTORY OF PRESENT ILLNESS:  Amber Harrison  is a 74 y.o. female with a known history of COPD on chronic oxygen, essential hypertension, hyperlipidemia, fibromyalgia presenting with regimen with constipation. She describes constipation for a total of 10 days, with associated nausea, decreased by mouth intake, abdominal distention however denies any actual vomiting. She also describes one day duration of pain localized left labia, described as "pain", 6 out of 10, radiating upwards to groin, no worsening/relieving factors, with associated redness. She denies any vaginal discharge, dysuria or change in urinary frequency. She also attests to having subjective fevers chills present to the hospital for the above symptoms  PAST MEDICAL HISTORY:   Past Medical History  Diagnosis Date  . COPD (chronic obstructive pulmonary disease)   . Fibromyalgia   . Emphysema of lung   . Hypertension   . High cholesterol   . Heart attack 2013  . Cancer 2013    lung, breast    PAST SURGICAL HISTORY:   Past Surgical History  Procedure Laterality Date  . Tonsillectomy    . Eye surgery Bilateral   . Mastectomy Bilateral   . Abdominal hysterectomy      total    SOCIAL HISTORY:   History  Substance Use Topics  . Smoking status: Current Every Day Smoker -- 0.50 packs/day    Types: Cigarettes  . Smokeless tobacco: Not on file  . Alcohol Use: No    FAMILY HISTORY:   Family History  Problem Relation Age of Onset  . CAD Other     DRUG ALLERGIES:   Allergies  Allergen Reactions  . Macrobid [employer] Macro]      REVIEW OF SYSTEMS:  REVIEW OF SYSTEMS:  CONSTITUTIONAL: Positive for fevers, chills, fatigue, weakness.  EYES: Denies blurred vision, double vision, or eye pain.  EARS, NOSE, THROAT: Denies tinnitus, ear pain, hearing loss.  RESPIRATORY: denies cough, shortness of breath, wheezing  CARDIOVASCULAR: Denies chest pain, palpitations, edema.  GASTROINTESTINAL: Positive nausea, constipation denies vomiting GENITOURINARY: Denies dysuria, hematuria.  ENDOCRINE: Denies nocturia or thyroid problems. HEMATOLOGIC AND LYMPHATIC: Denies easy bruising or bleeding.  SKIN: Denies rash or lesions.  MUSCULOSKELETAL: Denies pain in neck, back, shoulder, knees, hips, or further arthritic symptoms.  NEUROLOGIC: Denies paralysis, paresthesias.  PSYCHIATRIC: Denies anxiety or depressive symptoms. Otherwise full review of systems performed by me is negative.   MEDICATIONS AT HOME:   Prior to Admission medications   Not on File      VITAL SIGNS:  Blood pressure 131/85, pulse 93, temperature 98.1 F (36.7 C), temperature source Oral, resp. rate 24, height 5' (1.524 m), weight 92 lb (41.731 kg), SpO2 94 %.  PHYSICAL EXAMINATION:  VITAL SIGNS: Filed Vitals:   10/04/14 1830  BP: 131/85  Pulse: 93  Temp:   Resp:    GENERAL:74 y.o.female chronically ill/frail appearing  HEAD: Normocephalic, atraumatic.  EYES: Pupils equal, round, reactive to light. Extraocular muscles intact. No scleral icterus.  MOUTH: Dry mucosal membrane. Dentition intact. No abscess noted.  EAR, NOSE, THROAT: Clear without exudates. No external lesions.  NECK: Supple. No thyromegaly.  No nodules. No JVD.  PULMONARY: Clear to ascultation, without wheeze rails or rhonci. No use of accessory muscles, Good respiratory effort. good air entry bilaterally CHEST: Nontender to palpation.  CARDIOVASCULAR: S1 and S2. Regular rate and rhythm. 2/6 systolic ejection murmur, rubs, or gallops. No edema. Pedal pulses 2+ bilaterally.   GASTROINTESTINAL: Soft, nontender, minimal distention. No masses. Hyperactive bowel sounds. No hepatosplenomegaly.  Genitourinary: Left labia majora with erythema and edema, tenderness to palpation no frank focal point/abscess MUSCULOSKELETAL: No swelling, clubbing, or edema. Range of motion full in all extremities.  NEUROLOGIC: Cranial nerves II through XII are intact. No gross focal neurological deficits. Sensation intact. Reflexes intact.  SKIN: No ulceration, lesions, rashes, or cyanosis. Skin warm and dry. Turgor intact.  PSYCHIATRIC: Mood, affect within normal limits. The patient is awake, alert and oriented x 3. Insight, judgment intact.    LABORATORY PANEL:   CBC  Recent Labs Lab 10/04/14 1558  WBC 14.5*  HGB 14.2  HCT 42.4  PLT 273   ------------------------------------------------------------------------------------------------------------------  Chemistries   Recent Labs Lab 10/04/14 1558  NA 133*  K 3.5  CL 91*  CO2 35*  GLUCOSE 91  BUN 8  CREATININE 0.57  CALCIUM 8.3*  AST 32  ALT 19  ALKPHOS 87  BILITOT 0.9   ------------------------------------------------------------------------------------------------------------------  Cardiac Enzymes No results for input(s): TROPONINI in the last 168 hours. ------------------------------------------------------------------------------------------------------------------  RADIOLOGY:  Dg Abd 1 View  10/04/2014   CLINICAL DATA:  Constipation  EXAM: ABDOMEN - 1 VIEW  COMPARISON:  09/18/2014  FINDINGS: Scattered large and small bowel gas is noted. Postsurgical changes are seen. Multilevel vertebral augmentation is noted. Diffuse aortic calcifications are seen. No free air is noted.  IMPRESSION: Nonspecific abdomen.   Electronically Signed   By: Amber Harrison M.D.   On: 10/04/2014 16:25   Ct Abdomen Pelvis W Contrast  10/04/2014   CLINICAL DATA:  Patient with constipation for 10 days. Perineal edema and swelling for 1  day. History of lung cancer.  EXAM: CT ABDOMEN AND PELVIS WITH CONTRAST  TECHNIQUE: Multidetector CT imaging of the abdomen and pelvis was performed using the standard protocol following bolus administration of intravenous contrast.  CONTRAST:  80m OMNIPAQUE IOHEXOL 300 MG/ML  SOLN  COMPARISON:  CT 11/11/2013; 07/01/2012.  FINDINGS: Lower chest: Scarring and or atelectasis within the bilateral lower lobes. Heart is enlarged. 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  Hepatobiliary: Liver is normal in size and contour. Cholelithiasis. No surrounding inflammatory stranding to suggest acute cholecystitis. Stable mild intrahepatic biliary ductal dilatation. Common bile duct is unremarkable. Stable 11 mm low-attenuation lesion within the right hepatic lobe (image 30; series 2).  Pancreas: Unremarkable  Spleen: Unremarkable  Adrenals/Urinary Tract: Normal adrenal glands. Kidneys enhance symmetrically with contrast. Stable sub cm low-attenuation lesion interpolar region left kidney, too small to characterize. Right extrarenal pelvis. Urinary bladder is markedly distended.  Stomach/Bowel: Large amount of stool throughout the colon, large amount of stool throughout the colon. No evidence for bowel obstruction. No free fluid or free intraperitoneal air. There is circumferential wall thickening of the sigmoid colon (image 51; series 2).  Vascular/Lymphatic: Marked calcification of the abdominal aorta which is tortuous. No retroperitoneal lymphadenopathy.  Other: There is a nonspecific 3.6 x 3.2 cm soft tissue mass within the left aspect of the perineum (image 72; series 2). Cystocele.  Musculoskeletal: Re- demonstrated kyphoplasty within the L4, L2, L1 and T12 vertebral bodies.  IMPRESSION: 1. Large amount of stool throughout  the colon as can be seen with constipation. No evidence for bowel obstruction. 2. Circumferential wall thickening of the sigmoid colon within the pelvis is  nonspecific and may be secondary to underdistention, focal colitis or potentially mass at this location. Consider correlation with colonoscopy as clinically indicated. 3. Urinary bladder is markedly distended. 4. Nonspecific 3.6 cm soft tissue mass within the left aspect of the perineum, recommend correlation with direct visualization. 5. Cholelithiasis. 6. No significant interval change in diameter of descending thoracic aortic aneurysm.   Electronically Signed   By: Lovey Newcomer M.D.   On: 10/04/2014 19:41    EKG:  No orders found for this or any previous visit.  IMPRESSION AND PLAN:   74 year old Caucasian female history of COPD, oxygen dependent presenting with constipation of pelvic pain.  1.Sepsis, meeting septic criteria by heart rate, respiratory, leukocytosis present on arrival.  Panculture. Broad-spectrum antibiotics including vancomycin/Zosyn and taper antibiotics when culture data returns.Continue IV fluid hydration to keep mean arterial pressure greater than 65.  2. Constipation: Receiving enema in emergency department, initiate bowel regimen and 3. Hypertension essential: We'll need to reconcile medications previously she was on metoprolol 4.Venous thromboembolism prophylactic: Heparin subcutaneous       All the records are reviewed and case discussed with ED provider. Management plans discussed with the patient, family and they are in agreement.  CODE STATUS: DO NOT RESUSCITATE  TOTAL TIME TAKING CARE OF THIS PATIENT: 45 minutes.    Niraj Kudrna,  Karenann Cai.D on 10/04/2014 at 8:59 PM  Between 7am to 6pm - Pager - [phone]  After 6pm: House Pager: - [phone]  [facility]  Office  [phone]  CC: Primary care physician; No primary care provider on file.

## 2014-10-04 NOTE — ED Notes (Signed)
Pt from home with family; Pt reports constipation 10 days despite use of laxatives, reports Hospice nurse gave 2 enemas Wednesday and Thursday, placed on sorbital to help with BM (pt reports taking 8 times), pt also with enema this morning, no relief. Pt reports difficulty voiding now. Pt reports spot appeared yesterday on vagina, now with worsening pain and swelling. Pt is hospice patient for COPD, on 2L of oxygen at home.

## 2014-10-04 NOTE — ED Notes (Addendum)
Pt refuses to allow female RN to perform Soap Suds Enema. Pt requests female RN.

## 2014-10-05 MED ORDER — MORPHINE SULFATE ER 15 MG PO TBCR
30.0000 mg | EXTENDED_RELEASE_TABLET | Freq: Two times a day (BID) | ORAL | Status: DC
  Administered 2014-10-05 – 2014-10-10 (×11): 30 mg via ORAL
  Filled 2014-10-05 (×11): qty 2

## 2014-10-05 MED ORDER — MOMETASONE FURO-FORMOTEROL FUM 100-5 MCG/ACT IN AERO
2.0000 | INHALATION_SPRAY | Freq: Two times a day (BID) | RESPIRATORY_TRACT | Status: DC
  Administered 2014-10-05 – 2014-10-10 (×10): 2 via RESPIRATORY_TRACT
  Filled 2014-10-05: qty 8.8

## 2014-10-05 MED ORDER — ASPIRIN 81 MG PO CHEW
81.0000 mg | CHEWABLE_TABLET | Freq: Every day | ORAL | Status: DC
  Administered 2014-10-05 – 2014-10-10 (×5): 81 mg via ORAL
  Filled 2014-10-05 (×5): qty 1

## 2014-10-05 MED ORDER — TIOTROPIUM BROMIDE MONOHYDRATE 18 MCG IN CAPS
18.0000 ug | ORAL_CAPSULE | Freq: Every day | RESPIRATORY_TRACT | Status: DC
  Administered 2014-10-05 – 2014-10-10 (×5): 18 ug via RESPIRATORY_TRACT
  Filled 2014-10-05 (×2): qty 5

## 2014-10-05 MED ORDER — SORBITOL 70 % PO SOLN: 30.0000 mL | Freq: Two times a day (BID) | ORAL | Status: DC | PRN

## 2014-10-05 MED ORDER — VITAMIN D 1000 UNITS PO TABS
1000.0000 [IU] | ORAL_TABLET | Freq: Every day | ORAL | Status: DC
  Administered 2014-10-05 – 2014-10-10 (×5): 1000 [IU] via ORAL
  Filled 2014-10-05 (×6): qty 1

## 2014-10-05 MED ORDER — ALUM & MAG HYDROXIDE-SIMETH 200-200-20 MG/5ML PO SUSP
30.0000 mL | ORAL | Status: DC | PRN
  Administered 2014-10-05 – 2014-10-07 (×2): 30 mL via ORAL
  Filled 2014-10-05 (×3): qty 30

## 2014-10-05 MED ORDER — ALBUTEROL SULFATE (2.5 MG/3ML) 0.083% IN NEBU
3.0000 mL | INHALATION_SOLUTION | RESPIRATORY_TRACT | Status: DC | PRN
  Administered 2014-10-06 – 2014-10-10 (×4): 3 mL via RESPIRATORY_TRACT
  Filled 2014-10-05 (×4): qty 3

## 2014-10-05 MED ORDER — GUAIFENESIN ER 600 MG PO TB12
600.0000 mg | ORAL_TABLET | Freq: Two times a day (BID) | ORAL | Status: DC
  Administered 2014-10-05 – 2014-10-10 (×10): 600 mg via ORAL
  Filled 2014-10-05 (×10): qty 1

## 2014-10-05 MED ORDER — OXYCODONE HCL 5 MG PO TABS
10.0000 mg | ORAL_TABLET | ORAL | Status: DC | PRN
  Administered 2014-10-05 – 2014-10-08 (×9): 10 mg via ORAL
  Filled 2014-10-05 (×9): qty 2

## 2014-10-05 MED ORDER — NITROGLYCERIN 0.4 MG SL SUBL: 0.4000 mg | SUBLINGUAL_TABLET | SUBLINGUAL | Status: DC | PRN

## 2014-10-05 MED ORDER — METOPROLOL SUCCINATE ER 100 MG PO TB24
100.0000 mg | ORAL_TABLET | Freq: Every day | ORAL | Status: DC
  Administered 2014-10-05 – 2014-10-09 (×3): 100 mg via ORAL
  Filled 2014-10-05 (×6): qty 1

## 2014-10-05 MED ORDER — FLUTICASONE FUROATE-VILANTEROL 100-25 MCG/INH IN AEPB: 1.0000 | INHALATION_SPRAY | Freq: Every day | RESPIRATORY_TRACT | Status: DC

## 2014-10-05 MED ORDER — CLINDAMYCIN PHOSPHATE 600 MG/50ML IV SOLN
600.0000 mg | Freq: Three times a day (TID) | INTRAVENOUS | Status: DC
  Administered 2014-10-05 – 2014-10-08 (×9): 600 mg via INTRAVENOUS
  Filled 2014-10-05 (×14): qty 50

## 2014-10-05 MED ORDER — LISINOPRIL 20 MG PO TABS
20.0000 mg | ORAL_TABLET | Freq: Two times a day (BID) | ORAL | Status: DC
  Administered 2014-10-05 – 2014-10-07 (×6): 20 mg via ORAL
  Filled 2014-10-05 (×7): qty 1

## 2014-10-05 MED ORDER — PREDNISONE 1 MG PO TABS
5.0000 mg | ORAL_TABLET | Freq: Every day | ORAL | Status: DC
  Administered 2014-10-05 – 2014-10-10 (×5): 5 mg via ORAL
  Filled 2014-10-05 (×5): qty 5

## 2014-10-05 MED ORDER — SENNA 8.6 MG PO TABS: 1.0000 | ORAL_TABLET | Freq: Every evening | ORAL | Status: DC | PRN

## 2014-10-05 MED ORDER — MAGNESIUM HYDROXIDE 400 MG/5ML PO SUSP
30.0000 mL | Freq: Every day | ORAL | Status: DC | PRN
  Administered 2014-10-05: 16:00:00 30 mL via ORAL
  Filled 2014-10-05: qty 30

## 2014-10-05 MED ORDER — ENSURE ENLIVE PO LIQD
237.0000 mL | Freq: Three times a day (TID) | ORAL | Status: DC
  Administered 2014-10-08 – 2014-10-09 (×5): 237 mL via ORAL

## 2014-10-05 NOTE — Progress Notes (Signed)
Visit made. Mrs. Bandel is followed by Hospice and [place] at home with a  Hospice dx of COPD. She was admitted to [facility] through the ED for evaluation of cellulitis in her left labia and constipation. Patient seen at bedside alert. Daughter Judeen Hammans present during visit. Patient had just received IV morphine '2mg'$   for back pain. Patient assisted up to the [facility] during visit, she was able to urinate after aprox 15 minutes, no bowel movement. She is currently receiving both senna and docusate to promote a bowel movement, she is also receiving IVF and IV antibiotics Patient back in bed with lunch tray set up at end of visit.  Emotional support offered. Staff RN Mendel Ryder advised of void. Will continue to follow and update hospice team. Flo Shanks RN, BSN, [place] [facility] [place], [facility] [phone] c

## 2014-10-05 NOTE — Progress Notes (Addendum)
[facility] [place] NAME: Kaydi Kley    MR#:  [id number]  DATE OF BIRTH:  1940-11-29  SUBJECTIVE:  CHIEF COMPLAINT:  Constipation Chief Complaint  Patient presents with  . Constipation  . Vaginal Pain    REVIEW OF SYSTEMS:  CONSTITUTIONAL: No fever or chills, but has generalized weakness.  EYES: No blurred or double vision.  EARS, NOSE, AND THROAT: No tinnitus or ear pain.  RESPIRATORY: No cough, mild shortness of breath, no wheezing or hemoptysis.  CARDIOVASCULAR: No chest pain, orthopnea, edema.  GASTROINTESTINAL: No nausea, vomiting, diarrhea or abdominal pain.  GENITOURINARY: Has dysuria, no hematuria.  ENDOCRINE: No polyuria, nocturia,  HEMATOLOGY: No anemia, easy bruising or bleeding SKIN: No rash or lesion. MUSCULOSKELETAL: No joint pain or arthritis.   NEUROLOGIC: No tingling, numbness, weakness.  PSYCHIATRY: No anxiety or depression.   DRUG ALLERGIES:   Allergies  Allergen Reactions  . Macrobid [Nitrofurantoin Monohyd Macro]     VITALS:  Blood pressure 118/68, pulse 90, temperature 97.5 F (36.4 C), temperature source Oral, resp. rate 16, height 5' (1.524 m), weight 47.174 kg (104 lb), SpO2 92 %.  PHYSICAL EXAMINATION:  GENERAL:  74 y.o.-year-old patient lying in the bed with no acute distress.  EYES: Pupils equal, round, reactive to light and accommodation. No scleral icterus. Extraocular muscles intact.  HEENT: Head atraumatic, normocephalic. Oropharynx and nasopharynx clear.  NECK:  Supple, no jugular venous distention. No thyroid enlargement, no tenderness.  LUNGS: Normal breath sounds bilaterally, no wheezing, rales,rhonchi or crepitation. No use of accessory muscles of respiration.  CARDIOVASCULAR: S1, S2 normal. No murmurs, rubs, or gallops.  ABDOMEN: Soft, nontender, nondistended. Bowel sounds present. No organomegaly or mass.  EXTREMITIES: No pedal edema, cyanosis, or clubbing.  NEUROLOGIC: Cranial  nerves II through XII are intact. Muscle strength 5/5 in all extremities. Sensation intact. Gait not checked.  PSYCHIATRIC: The patient is alert and oriented x 3.  SKIN: No obvious rash, lesion, or ulcer.    LABORATORY PANEL:   CBC  Recent Labs Lab 10/04/14 1558  WBC 14.5*  HGB 14.2  HCT 42.4  PLT 273   ------------------------------------------------------------------------------------------------------------------  Chemistries   Recent Labs Lab 10/04/14 1558  NA 133*  K 3.5  CL 91*  CO2 35*  GLUCOSE 91  BUN 8  CREATININE 0.57  CALCIUM 8.3*  AST 32  ALT 19  ALKPHOS 87  BILITOT 0.9   ------------------------------------------------------------------------------------------------------------------  Cardiac Enzymes No results for input(s): TROPONINI in the last 168 hours. ------------------------------------------------------------------------------------------------------------------  RADIOLOGY:  Dg Abd 1 View  10/04/2014   CLINICAL DATA:  Constipation  EXAM: ABDOMEN - 1 VIEW  COMPARISON:  09/18/2014  FINDINGS: Scattered large and small bowel gas is noted. Postsurgical changes are seen. Multilevel vertebral augmentation is noted. Diffuse aortic calcifications are seen. No free air is noted.  IMPRESSION: Nonspecific abdomen.   Electronically Signed   By: Inez Catalina M.D.   On: 10/04/2014 16:25   Ct Abdomen Pelvis W Contrast  10/04/2014   CLINICAL DATA:  Patient with constipation for 10 days. Perineal edema and swelling for 1 day. History of lung cancer.  EXAM: CT ABDOMEN AND PELVIS WITH CONTRAST  TECHNIQUE: Multidetector CT imaging of the abdomen and pelvis was performed using the standard protocol following bolus administration of intravenous contrast.  CONTRAST:  67m OMNIPAQUE IOHEXOL 300 MG/ML  SOLN  COMPARISON:  CT 11/11/2013; 07/01/2012.  FINDINGS: Lower chest: Scarring and or atelectasis within the bilateral lower lobes. Heart is  enlarged. 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  Hepatobiliary: Liver is normal in size and contour. Cholelithiasis. No surrounding inflammatory stranding to suggest acute cholecystitis. Stable mild intrahepatic biliary ductal dilatation. Common bile duct is unremarkable. Stable 11 mm low-attenuation lesion within the right hepatic lobe (image 30; series 2).  Pancreas: Unremarkable  Spleen: Unremarkable  Adrenals/Urinary Tract: Normal adrenal glands. Kidneys enhance symmetrically with contrast. Stable sub cm low-attenuation lesion interpolar region left kidney, too small to characterize. Right extrarenal pelvis. Urinary bladder is markedly distended.  Stomach/Bowel: Large amount of stool throughout the colon, large amount of stool throughout the colon. No evidence for bowel obstruction. No free fluid or free intraperitoneal air. There is circumferential wall thickening of the sigmoid colon (image 51; series 2).  Vascular/Lymphatic: Marked calcification of the abdominal aorta which is tortuous. No retroperitoneal lymphadenopathy.  Other: There is a nonspecific 3.6 x 3.2 cm soft tissue mass within the left aspect of the perineum (image 72; series 2). Cystocele.  Musculoskeletal: Re- demonstrated kyphoplasty within the L4, L2, L1 and T12 vertebral bodies.  IMPRESSION: 1. Large amount of stool throughout the colon as can be seen with constipation. No evidence for bowel obstruction. 2. Circumferential wall thickening of the sigmoid colon within the pelvis is nonspecific and may be secondary to underdistention, focal colitis or potentially mass at this location. Consider correlation with colonoscopy as clinically indicated. 3. Urinary bladder is markedly distended. 4. Nonspecific 3.6 cm soft tissue mass within the left aspect of the perineum, recommend correlation with direct visualization. 5. Cholelithiasis. 6. No significant interval change in diameter of descending thoracic aortic aneurysm.    Electronically Signed   By: Lovey Newcomer M.D.   On: 10/04/2014 19:41    EKG:  No orders found for this or any previous visit.  ASSESSMENT AND PLAN:  74 year old Caucasian female history of COPD, oxygen dependent presenting with constipation of pelvic pain.  1.Sepsis with cellulitis in her left labia. Follow-up Panculture and the CBC. Continue Broad-spectrum antibiotics including vancomycin/Zosyn and taper antibiotics when culture data returns. 2. Constipation: Continue bowel regimen and start PT. 3. Hypertension essential: on metoprolol and lisinopril.  Hyponatremia. Continue normal saline IV and follow-up BMP. COPD. Stable. Continue patient home nebulizer treatment. Tobacco abuse: Smoking cessation was counseled for 3 min.      All the records are reviewed and case discussed with Care Management/Social Workerr. Management plans discussed with the patient, the patient's daughter and they are in agreement.  CODE STATUS: DO NOT RESUSCITATE  TOTAL TIME TAKING CARE OF THIS PATIENT: 43 minutes.   POSSIBLE D/C IN 3 DAYS, DEPENDING ON CLINICAL CONDITION.   Demetrios Loll M.D on 10/05/2014 at 2:09 PM  Between 7am to 6pm - Pager - [phone]  After 6pm go to [email or web address] - password [email or web address] [facility]  [place] [facility]  Office  [phone]  CC: Primary care physician; No primary care provider on file.

## 2014-10-05 NOTE — Plan of Care (Signed)
Problem: Discharge Progression Outcomes Goal: Discharge plan in place and appropriate Individualization  Patient continues to have constipation, passing gas no bowel movement today.  Requiring frequent coverage for chronic back pain.  Positive bowel sounds, checked for impaction, none found.  Up to bedside commode with minimal assist.

## 2014-10-05 NOTE — Progress Notes (Signed)
Initial Nutrition Assessment  DOCUMENTATION CODES:     INTERVENTION: Medical Nutrition Supplement: Ensure Enlive (each supplement provides 350kcal and 20 grams of protein) TID- Chocolate Meals and Snacks: Cater to patient preferences Coordination of Care: Liberalize diet to maximize intake; spoke with Bridgett Larsson, MD- agreeable to change diet from heart healthy to regular diet.  NUTRITION DIAGNOSIS:  Inadequate oral intake related to acute illness as evidenced by per patient/family report.    GOAL:  Patient will meet greater than or equal to 90% of their needs    MONITOR:   (Energy Intake, Supplement Acceptance, Electrolyte and renal Profile, Anthropometrics)  REASON FOR ASSESSMENT:  Malnutrition Screening Tool    ASSESSMENT:  Reason For Admission: Sepsis PMHx: Past Medical History  Diagnosis Date  . COPD (chronic obstructive pulmonary disease)   . Fibromyalgia   . Emphysema of lung   . Hypertension   . High cholesterol   . Heart attack 2013  . Cancer 2013    lung, breast    Typical Fluid/ Food Intake: No intake recorded, patient reports a poor intake of meals Meal/ Snack Patterns: Patient reports eating a regular diet with no restrictions PTA. She reports a poor appetite and intake x last few weeks. Also reports never "having a big appetite".  Supplements: None PTA  Labs:  Electrolyte and Renal Profile:    Recent Labs Lab 10/04/14 1558  BUN 8  CREATININE 0.57  NA 133*  K 3.5   Protein Profile:  Recent Labs Lab 10/04/14 1558  ALBUMIN 3.0*    Meds: Vit D, Colace, MOM  Physical Findings:  Unable to complete Nutrition-Focused physical exam at this time.   Weight Changes: Pt reports a UBW of 110-115, last known >3 years ago. Reviewed hospital records x 3 years ago- pt weight was 93.4 in July 2013- no significant weight changes to note x 3 years, but patient does report losing "alot of weight".  Height:  Ht Readings from Last 1 Encounters:   10/04/14 5' (1.524 m)    Weight:  Wt Readings from Last 1 Encounters:  10/04/14 104 lb (47.174 kg)    Ideal Body Weight:     Wt Readings from Last 10 Encounters:  10/04/14 104 lb (47.174 kg)    BMI:  Body mass index is 20.31 kg/(m^2). BMI was 18.0 on admission.  Estimated Nutritional Needs:  Kcal:  [phone] kcal/ day (BEE: 830 x 1.2 AF x 1.0-1.2 IF)  Protein:  50-59 g Pro/day (1.2-1.4 g Pro/ kg/ day)  Fluid:  1045-1255 ml/ day (25-30 ml/kg)  Skin:  Reviewed, no issues  Diet Order:  Diet Heart Room service appropriate?: Yes; Fluid consistency:: Thin  EDUCATION NEEDS:  No education needs identified at this time   Intake/Output Summary (Last 24 hours) at 10/05/14 1106 Last data filed at 10/05/14 0900  Gross per 24 hour  Intake 923.33 ml  Output    450 ml  Net 473.33 ml    Last BM:  5/11  Roda Shutters, RDN Pager: [phone] Office: [place]

## 2014-10-05 NOTE — Care Management Note (Signed)
Case Management Note  Patient Details  Name: Amber Harrison MRN: [id number] Date of Birth: 12/14/1940  Subjective/Objective:                    Action/Plan:   Expected Discharge Date:                  Expected Discharge Plan:     In-House Referral:     Discharge planning Services     Post Acute Care Choice:    Choice offered to:     DME Arranged:    DME Agency:     HH Arranged:    [place] Agency:     Status of Service:     Medicare Important Message Given: Yes    Medicare IM give by:   Hester Mates, RN Date Additional Medicare IM Given:    Additional Medicare Important Message give by:     If discussed at [place] of Stay Meetings, dates discussed:  10/05/14  Additional Comments:  Dana Dorner A, RN 10/05/2014, 8:40 AM

## 2014-10-05 NOTE — Plan of Care (Signed)
Problem: Discharge Progression Outcomes Goal: Discharge plan in place and appropriate Individualization Pt lives at home with her husband, followed by Hospice Constipation and cellulitis to left labia High fall risk-bed alarm activated Hx breast and lung Cancer, HTN, COPD, chronic back pain-controlled with home medications    Goal: Other Discharge Outcomes/Goals Plan of care progress to goal:  Pain: given pain med several times during the night with improvement Hemo: IVF infusing   Diet: pt with poor appetite Activity: one assist up to bsc

## 2014-10-05 NOTE — Progress Notes (Addendum)
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  Pending MRI.   Ramond Dial 10/05/2014, 3:46 PM   [name], PT MS Acute Rehab Dept. Number: [facility] [id number] and [place] [phone]

## 2014-10-05 NOTE — Progress Notes (Signed)
Dr. Lavetta Nielsen notified of pt complaints of indigestion, orders received.

## 2014-10-06 LAB — CBC
HCT: 42.5 % (ref 35.0–47.0)
Hemoglobin: 14 g/dL (ref 12.0–16.0)
MCH: 33.5 pg (ref 26.0–34.0)
MCHC: 32.9 g/dL (ref 32.0–36.0)
MCV: 101.7 fL — ABNORMAL HIGH (ref 80.0–100.0)
Platelets: 286 10*3/uL (ref 150–440)
RBC: 4.18 MIL/uL (ref 3.80–5.20)
RDW: 13.5 % (ref 11.5–14.5)
WBC: 12.4 10*3/uL — ABNORMAL HIGH (ref 3.6–11.0)

## 2014-10-06 MED ORDER — BISACODYL 10 MG RE SUPP
10.0000 mg | Freq: Once | RECTAL | Status: AC
  Administered 2014-10-06: 10 mg via RECTAL
  Filled 2014-10-06: qty 1

## 2014-10-06 NOTE — Progress Notes (Signed)
[facility] [place] NAME: Amber Harrison    MR#:  [id number]  DATE OF BIRTH:  [birth year]  SUBJECTIVE:  CHIEF COMPLAINT:  Still Constipation, and able to get a PT due to back pain Chief Complaint  Patient presents with  . Constipation  . Vaginal Pain    REVIEW OF SYSTEMS:  CONSTITUTIONAL: No fever or chills, but has generalized weakness.  EYES: No blurred or double vision.  EARS, NOSE, AND THROAT: No tinnitus or ear pain.  RESPIRATORY: No cough, mild shortness of breath, no wheezing or hemoptysis.  CARDIOVASCULAR: No chest pain, orthopnea, edema.  GASTROINTESTINAL: No nausea, vomiting, diarrhea or abdominal pain. Constipation. GENITOURINARY: Has dysuria, no hematuria.  ENDOCRINE: No polyuria, nocturia,  HEMATOLOGY: No anemia, easy bruising or bleeding SKIN: No rash or lesion. MUSCULOSKELETAL: No joint pain or arthritis.   NEUROLOGIC: No tingling, numbness, weakness.  PSYCHIATRY: No anxiety or depression.   DRUG ALLERGIES:   Allergies  Allergen Reactions  . Macrobid [Nitrofurantoin Monohyd Macro]     VITALS:  Blood pressure 117/65, pulse 85, temperature 98.8 F (37.1 C), temperature source Oral, resp. rate 20, height 5' (1.524 m), weight 47.174 kg (104 lb), SpO2 89 %.  PHYSICAL EXAMINATION:  GENERAL:  74 y.o.-year-old patient lying in the bed with no acute distress.  EYES: Pupils equal, round, reactive to light and accommodation. No scleral icterus. Extraocular muscles intact.  HEENT: Head atraumatic, normocephalic. Oropharynx and nasopharynx clear.  NECK:  Supple, no jugular venous distention. No thyroid enlargement, no tenderness.  LUNGS: Normal breath sounds bilaterally, no wheezing, rales,rhonchi or crepitation. No use of accessory muscles of respiration.  CARDIOVASCULAR: S1, S2 normal. No murmurs, rubs, or gallops.  ABDOMEN: Soft, nontender, nondistended. Bowel sounds present. No organomegaly or mass.  EXTREMITIES: No  pedal edema, cyanosis, or clubbing.  NEUROLOGIC: Cranial nerves II through XII are intact. Muscle strength 5/5 in all extremities. Sensation intact. Gait not checked.  PSYCHIATRIC: The patient is alert and oriented x 3.  SKIN: No obvious rash, lesion, or ulcer.    LABORATORY PANEL:   CBC  Recent Labs Lab 10/06/14 0446  WBC 12.4*  HGB 14.0  HCT 42.5  PLT 286   ------------------------------------------------------------------------------------------------------------------  Chemistries   Recent Labs Lab 10/04/14 1558  NA 133*  K 3.5  CL 91*  CO2 35*  GLUCOSE 91  BUN 8  CREATININE 0.57  CALCIUM 8.3*  AST 32  ALT 19  ALKPHOS 87  BILITOT 0.9   ------------------------------------------------------------------------------------------------------------------  Cardiac Enzymes No results for input(s): TROPONINI in the last 168 hours. ------------------------------------------------------------------------------------------------------------------  RADIOLOGY:  Dg Abd 1 View  10/04/2014   CLINICAL DATA:  Constipation  EXAM: ABDOMEN - 1 VIEW  COMPARISON:  09/18/2014  FINDINGS: Scattered large and small bowel gas is noted. Postsurgical changes are seen. Multilevel vertebral augmentation is noted. Diffuse aortic calcifications are seen. No free air is noted.  IMPRESSION: Nonspecific abdomen.   Electronically Signed   By: Inez Catalina M.D.   On: 10/04/2014 16:25   Ct Abdomen Pelvis W Contrast  10/04/2014   CLINICAL DATA:  Patient with constipation for 10 days. Perineal edema and swelling for 1 day. History of lung cancer.  EXAM: CT ABDOMEN AND PELVIS WITH CONTRAST  TECHNIQUE: Multidetector CT imaging of the abdomen and pelvis was performed using the standard protocol following bolus administration of intravenous contrast.  CONTRAST:  26m OMNIPAQUE IOHEXOL 300 MG/ML  SOLN  COMPARISON:  CT 11/11/2013; 07/01/2012.  FINDINGS: Lower chest:  Scarring and or atelectasis within the  bilateral lower lobes. Heart is enlarged. 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  Hepatobiliary: Liver is normal in size and contour. Cholelithiasis. No surrounding inflammatory stranding to suggest acute cholecystitis. Stable mild intrahepatic biliary ductal dilatation. Common bile duct is unremarkable. Stable 11 mm low-attenuation lesion within the right hepatic lobe (image 30; series 2).  Pancreas: Unremarkable  Spleen: Unremarkable  Adrenals/Urinary Tract: Normal adrenal glands. Kidneys enhance symmetrically with contrast. Stable sub cm low-attenuation lesion interpolar region left kidney, too small to characterize. Right extrarenal pelvis. Urinary bladder is markedly distended.  Stomach/Bowel: Large amount of stool throughout the colon, large amount of stool throughout the colon. No evidence for bowel obstruction. No free fluid or free intraperitoneal air. There is circumferential wall thickening of the sigmoid colon (image 51; series 2).  Vascular/Lymphatic: Marked calcification of the abdominal aorta which is tortuous. No retroperitoneal lymphadenopathy.  Other: There is a nonspecific 3.6 x 3.2 cm soft tissue mass within the left aspect of the perineum (image 72; series 2). Cystocele.  Musculoskeletal: Re- demonstrated kyphoplasty within the L4, L2, L1 and T12 vertebral bodies.  IMPRESSION: 1. Large amount of stool throughout the colon as can be seen with constipation. No evidence for bowel obstruction. 2. Circumferential wall thickening of the sigmoid colon within the pelvis is nonspecific and may be secondary to underdistention, focal colitis or potentially mass at this location. Consider correlation with colonoscopy as clinically indicated. 3. Urinary bladder is markedly distended. 4. Nonspecific 3.6 cm soft tissue mass within the left aspect of the perineum, recommend correlation with direct visualization. 5. Cholelithiasis. 6. No significant interval  change in diameter of descending thoracic aortic aneurysm.   Electronically Signed   By: Lovey Newcomer M.D.   On: 10/04/2014 19:41    EKG:  No orders found for this or any previous visit.  ASSESSMENT AND PLAN:  74 year old Caucasian female history of COPD, oxygen dependent presenting with constipation of pelvic pain.  1.Sepsis with cellulitis in her left labia. Neg blood culture and better leukocytosis.  Broad-spectrum antibiotics including vancomycin/Zosyn was discontinued and on clindamycin now. 2. Constipation: Continue bowel regimen. 3. Hypertension essential: on metoprolol and lisinopril.  Hyponatremia. Continue normal saline IV and follow-up BMP. COPD. Stable. Continue patient home nebulizer treatment. Tobacco abuse: Smoking cessation was counseled for 3 min.      All the records are reviewed and case discussed with Care Management/Social Workerr. Management plans discussed with the patient, the patient's daughter and they are in agreement.  CODE STATUS: DO NOT RESUSCITATE  TOTAL TIME TAKING CARE OF THIS PATIENT: 35 minutes.   POSSIBLE D/C HOME WITH HOSPICE IN 1-2 DAYS, DEPENDING ON CLINICAL CONDITION.   Demetrios Loll M.D on 10/06/2014 at 2:09 PM  Between 7am to 6pm - Pager - [phone]  After 6pm go to [email or web address] - password [email or web address] [facility]  [place] [facility]  Office  [phone]  CC: Primary care physician; No primary care provider on file.

## 2014-10-06 NOTE — Plan of Care (Signed)
Problem: Discharge Progression Outcomes Goal: Discharge plan in place and appropriate Individualization  Outcome: Not Progressing Though discharge plans are in progress, pt is not ready for discharge at this time.  Goal: Pain controlled with appropriate interventions Outcome: Progressing Pt has complained of pain 6-7/10 on pain scale and has required PRN medications during my shift.  Goal: Tolerating diet Outcome: Not Progressing Pt continues to have a poor appetite during my shift. When RN asked home hospice RN about pt's appetite, she confirms that pt has a poor appetite at home as well.  Goal: Activity appropriate for discharge plan Outcome: Progressing Pt refused walk this morning with RN stating her back hurts too much to walk. When RN questions how pt gets around at home, pt stated she only goes from bed to bathroom and back. RN encouraged pt to sit in chair for lunch, pt refused to sit in chair but sat on BSC for 45 minutes and ate very little lunch. Per pt's request, she asked to sit on [facility] because she felt she had to void and would eat while sitting there.   Problem: Problem: Bowel/Bladder Progression Goal: NO CONSTIPATION Outcome: Not Progressing Pt has had one BM this shift that was formed, brown, and medium sized after receiving a suppository this morning during my shift.

## 2014-10-06 NOTE — Care Management Note (Signed)
Case Management Note  Patient Details  Name: [name] MRN: [id number] Date of Birth: 1940-10-04  Subjective/Objective:                  Patient resting in bed with sister-in-law at bedside. Patient answered most questioned between dosing off to sleep and waking when I asked a question. Patient if from home with her husband Charlotte Crumb and daughter next door. She is followed by [facility] and has chronic O2 at home.  Poor PO per RN and chronic back pain receiving IV Morphine plus PO pain med.  Action/Plan:  RNCM notified Santiago Glad with [facility] hospice of patient admission on 10/04/14.  Expected Discharge Date:                  Expected Discharge Plan:  Home w Hospice Care  In-House Referral:     Discharge planning Services  CM Consult  Post Acute Care Choice:    Choice offered to:  Patient, Sibling  DME Arranged:  N/A DME Agency:     HH Arranged:  RN [place] Agency:  [facility]  Status of Service:     Medicare Important Message Given:  Yes Date Medicare IM Given:  10/06/14 Medicare IM give by:  Marshell Garfinkel Date Additional Medicare IM Given:    Additional Medicare Important Message give by:     If discussed at [place] of Stay Meetings, dates discussed:    Additional Comments:  Marshell Garfinkel, RN 10/06/2014, 11:15 AM

## 2014-10-06 NOTE — Progress Notes (Signed)
PT Cancellation Note  Patient Details Name: Amber Harrison MRN: [id number] DOB: May 05, 1941   Cancelled Treatment:    Reason Eval/Treat Not Completed: Fatigue/lethargy limiting ability to participate.  Will retry later if able.   Ramond Dial 10/06/2014, 3:26 PM   [name], PT MS Acute Rehab Dept. Number: [facility] [id number] and [place] [phone]

## 2014-10-06 NOTE — Progress Notes (Signed)
Visit made. Patient seen lying in bed, very drowsy from PRN pain medication. Daughter Judeen Hammans present through out visit. Mrs. Tuller reports continued back pain.  She has been unable to work with physical therapy due to her back pain. Of note per chart review, patient has received 6 Mg of IV morphine and 20 mg of oxycodone.  She did have a small bowel movement this morning, her appetite remains poor.  Patient also reports continued difficulty urinating, staff RN [place] aware. WBC's down to 12.4 from 14.5 on admission. IV abt continue. Patient resting with eyes closed at end of visit. Will continue to follow. Flo Shanks RN, BSN, [place] [facility] [place], [facility] [phone] c

## 2014-10-06 NOTE — Plan of Care (Signed)
Problem: Discharge Progression Outcomes Goal: Discharge plan in place and appropriate Individualization  Pt lives at home with her husband, followed by Hospice Constipation and cellulitis to left labia High fall risk-bed alarm activated Hx breast and lung Cancer, HTN, COPD, chronic back pain-controlled with home medications     Goal: Other Discharge Outcomes/Goals Plan of care progress to goal for: sepsis  - Continues IV ABX - Continues IV fluids - morphine IV and roxicodone PO given for pain with improvement. Will continue to monitor.

## 2014-10-07 ENCOUNTER — Inpatient Hospital Stay

## 2014-10-07 LAB — BASIC METABOLIC PANEL
Anion gap: 7 (ref 5–15)
BUN: 7 mg/dL (ref 6–20)
CALCIUM: 8 mg/dL — AB (ref 8.9–10.3)
CHLORIDE: 95 mmol/L — AB (ref 101–111)
CO2: 32 mmol/L (ref 22–32)
Creatinine, Ser: 0.45 mg/dL (ref 0.44–1.00)
GFR calc non Af Amer: >60 mL/min (ref >60)
GLUCOSE: 91 mg/dL (ref 65–99)
POTASSIUM: 3.4 mmol/L — AB (ref 3.5–5.1)
Sodium: 134 mmol/L — ABNORMAL LOW (ref 135–145)

## 2014-10-07 MED ORDER — LACTULOSE 10 GM/15ML PO SOLN
30.0000 g | Freq: Once | ORAL | Status: AC
  Administered 2014-10-07: 30 g via ORAL
  Filled 2014-10-07: qty 60

## 2014-10-07 MED ORDER — METHYLNALTREXONE BROMIDE 12 MG/0.6ML ~~LOC~~ SOLN
12.0000 mg | SUBCUTANEOUS | Status: AC
  Administered 2014-10-07: 12 mg via SUBCUTANEOUS
  Filled 2014-10-07 (×2): qty 0.6

## 2014-10-07 MED ORDER — PANTOPRAZOLE SODIUM 40 MG IV SOLR
40.0000 mg | INTRAVENOUS | Status: DC
  Administered 2014-10-07: 21:00:00 40 mg via INTRAVENOUS
  Filled 2014-10-07: qty 40

## 2014-10-07 NOTE — Progress Notes (Signed)
Physical Therapy Discharge Patient Details Name: Amber Harrison MRN: [id number] DOB: July 19, 1940 Today's Date: 10/07/2014 Time:  -     Patient discharged from PT services secondary to medical decline - will need to re-order PT to resume therapy services.  Please see latest therapy progress note for current level of functioning and progress toward goals.    Progress and discharge plan discussed with patient and/or caregiver: Family agrees with not seeing pt and pt unable to participate in discussion this PM but refused earlier and each day prior to this. GP  [name], PT MS Acute Rehab Dept. Number: [facility] [phone] and [place] [phone]     Ramond Dial 10/07/2014, 1:24 PM

## 2014-10-07 NOTE — Progress Notes (Signed)
[place] [facility] [place] NAME: Amber Harrison    MR#:  [id number]  DATE OF BIRTH:  1940-12-20  SUBJECTIVE:  CHIEF COMPLAINT:  Had a very small bowel movement. Significant diffuse abdominal pain. Vaginal pain improved.  Chief Complaint  Patient presents with  . Constipation  . Vaginal Pain    REVIEW OF SYSTEMS:  CONSTITUTIONAL: No fever or chills, but has generalized weakness.  EYES: No blurred or double vision.  EARS, NOSE, AND THROAT: No tinnitus or ear pain.  RESPIRATORY: No cough, mild shortness of breath, no wheezing or hemoptysis.  CARDIOVASCULAR: No chest pain, orthopnea, edema.  GASTROINTESTINAL: No nausea, vomiting, diarrhea. Constipation. Abdominal pain. GENITOURINARY: Has dysuria, no hematuria.  ENDOCRINE: No polyuria, nocturia,  HEMATOLOGY: No anemia, easy bruising or bleeding SKIN: No rash or lesion. Cellulitis perenium. MUSCULOSKELETAL: No joint pain or arthritis.   NEUROLOGIC: No tingling, numbness, weakness.  PSYCHIATRY: No anxiety or depression.   DRUG ALLERGIES:   Allergies  Allergen Reactions  . Macrobid [Nitrofurantoin Monohyd Macro]     VITALS:  Blood pressure 139/78, pulse 97, temperature 98.8 F (37.1 C), temperature source Oral, resp. rate 20, height 5' (1.524 m), weight 47.174 kg (104 lb), SpO2 94 %.  PHYSICAL EXAMINATION:  GENERAL:  74 y.o.-year-old patient lying in the bed with no acute distress.  EYES: Pupils equal, round, reactive to light and accommodation. No scleral icterus. Extraocular muscles intact.  HEENT: Head atraumatic, normocephalic. Oropharynx and nasopharynx clear.  NECK:  Supple, no jugular venous distention. No thyroid enlargement, no tenderness.  LUNGS: Normal breath sounds bilaterally, no wheezing, rales,rhonchi or crepitation. No use of accessory muscles of respiration.  CARDIOVASCULAR: S1, S2 normal. No murmurs, rubs, or gallops.  ABDOMEN: Soft. Bowel sounds present. No  organomegaly or mass. Tender. Distended. EXTREMITIES: No pedal edema, cyanosis, or clubbing.  NEUROLOGIC: Cranial nerves II through XII are intact. Muscle strength 5/5 in all extremities. Sensation intact. Gait not checked.  PSYCHIATRIC: The patient is alert and oriented x 3.  SKIN: No obvious rash, lesion, or ulcer.  Left labia majora, erythema, tender.   LABORATORY PANEL:   CBC  Recent Labs Lab 10/06/14 0446  WBC 12.4*  HGB 14.0  HCT 42.5  PLT 286   ------------------------------------------------------------------------------------------------------------------  Chemistries   Recent Labs Lab 10/04/14 1558 10/07/14 0348  NA 133* 134*  K 3.5 3.4*  CL 91* 95*  CO2 35* 32  GLUCOSE 91 91  BUN 8 7  CREATININE 0.57 0.45  CALCIUM 8.3* 8.0*  AST 32  --   ALT 19  --   ALKPHOS 87  --   BILITOT 0.9  --    ------------------------------------------------------------------------------------------------------------------  Cardiac Enzymes No results for input(s): TROPONINI in the last 168 hours. ------------------------------------------------------------------------------------------------------------------  RADIOLOGY:  No results found.  EKG:  No orders found for this or any previous visit.  ASSESSMENT AND PLAN:  74 year old Caucasian female history of COPD, oxygen dependent presenting with constipation of pelvic pain.  * Constipation: Continue bowel regimen. Have tried multiple agent including enema. Will get an xray to r/o ileus or obstruction. Will give Relistor if xay shows nothing acute. Cause of abdominal pain  * Sepsis with cellulitis in her left labia. Neg blood culture and better leukocytosis.  Broad-spectrum antibiotics including vancomycin/Zosyn was discontinued and on clindamycin now.  * Hypertension essential: on metoprolol and lisinopril.  * Chronic back pain- Same  * Hyponatremia. Mild. Asymptomatic.  * COPD. Stable. Continue patient home  nebulizer treatment.  Discussed in detail with patient and family. Reviewed old records and answered all questions.  All the records are reviewed and case discussed with Care Management/Social Workerr. Management plans discussed with the patient, the patient's daughter and they are in agreement.  CODE STATUS: DO NOT RESUSCITATE   TOTAL TIME TAKING CARE OF THIS PATIENT: 40 minutes.     [name] R M.D on 10/07/2014 at 12:17 PM  Between 7am to 6pm - Pager - [phone]  After 6pm go to [email or web address] - password [email or web address] [facility]  [facility]  Office  [phone]  CC: Primary care physician; No primary care provider on file.

## 2014-10-07 NOTE — Plan of Care (Signed)
Problem: Discharge Progression Outcomes Goal: Discharge plan in place and appropriate Individualization  Pt lives at home with her husband, followed by Hospice Constipation and cellulitis to left labia High fall risk-bed alarm activated Hx breast and lung Cancer, HTN, COPD, chronic back pain-controlled with home medications      Goal: Other Discharge Outcomes/Goals - Continues IV ABX - Continues IV fluids - Pain control via scheduled pain med. Will continue to monitor.

## 2014-10-07 NOTE — Progress Notes (Signed)
PT Cancellation Note  Patient Details Name: Amber Harrison MRN: [id number] DOB: 1941-01-07   Cancelled Treatment:    Reason Eval/Treat Not Completed: Fatigue/lethargy limiting ability to participate;Medical issues which prohibited therapy.  Vomiting and declining tx, did not eat breakfast.  Will try later on.   Ramond Dial 10/07/2014, 10:06 AM   [name], PT MS Acute Rehab Dept. Number: [facility] [phone] and [place] [phone]

## 2014-10-07 NOTE — Plan of Care (Signed)
Problem: Discharge Progression Outcomes Goal: Discharge plan in place and appropriate Individualization  Pt lives at home with her husband, followed by Hospice Constipation and cellulitis to left labia High fall risk-bed alarm activated Hx breast and lung Cancer, HTN, COPD, chronic back pain-controlled with home medications         Goal: Other Discharge Outcomes/Goals Plan of care progress to goal: Frequent pain medication with temporary relief Pt with continued constipation-XR of abd, given IV med to induce stool Unable to tolerate diet, zofran given x1 with minimal improvement

## 2014-10-07 NOTE — Progress Notes (Addendum)
Visit made. Patient seen lying in bed, awake, reporting nausea, vomited small amount during visit. Writer spoke with staff RN Margreta Journey, patient had been given zofran just prior to visit. Per Margreta Journey, patient was unable to take her morning medications d/t her nausea. Patient has received 2 doses, '4mg'$  total of IV morphine since 4 am this morning, for continued back pain. Writer to speak with attending Dr. Darvin Neighbours to request a Palliative Care consult for pain management, Christina aware. Will continue to follow and update Hospice team. Thank you. Flo Shanks RN, BSN, [place] [facility] [place] [facility] [place], [facility] [phone] c

## 2014-10-08 ENCOUNTER — Encounter: Payer: Self-pay | Admitting: Urgent Care

## 2014-10-08 LAB — CBC WITH DIFFERENTIAL/PLATELET
BASOS PCT: 0 %
Basophils Absolute: 0 10*3/uL (ref 0–0.1)
EOS ABS: 0 10*3/uL (ref 0–0.7)
EOS PCT: 0 %
HEMATOCRIT: 38.8 % (ref 35.0–47.0)
HEMOGLOBIN: 12.9 g/dL (ref 12.0–16.0)
Lymphocytes Relative: 7 %
Lymphs Abs: 0.5 10*3/uL — ABNORMAL LOW (ref 1.0–3.6)
MCH: 33.5 pg (ref 26.0–34.0)
MCHC: 33.1 g/dL (ref 32.0–36.0)
MCV: 101.3 fL — ABNORMAL HIGH (ref 80.0–100.0)
MONO ABS: 1.2 10*3/uL — AB (ref 0.2–0.9)
Monocytes Relative: 16 %
NEUTROS ABS: 6 10*3/uL (ref 1.4–6.5)
Neutrophils Relative %: 77 %
PLATELETS: 285 10*3/uL (ref 150–440)
RBC: 3.83 MIL/uL (ref 3.80–5.20)
RDW: 13.6 % (ref 11.5–14.5)
WBC: 7.8 10*3/uL (ref 3.6–11.0)

## 2014-10-08 MED ORDER — PANTOPRAZOLE SODIUM 40 MG PO TBEC
40.0000 mg | DELAYED_RELEASE_TABLET | ORAL | Status: DC
  Administered 2014-10-08 – 2014-10-10 (×3): 40 mg via ORAL
  Filled 2014-10-08 (×3): qty 1

## 2014-10-08 MED ORDER — BISACODYL 10 MG RE SUPP
10.0000 mg | Freq: Once | RECTAL | Status: DC
  Filled 2014-10-08: qty 1

## 2014-10-08 MED ORDER — CLINDAMYCIN HCL 150 MG PO CAPS
300.0000 mg | ORAL_CAPSULE | Freq: Three times a day (TID) | ORAL | Status: DC
  Administered 2014-10-08 – 2014-10-10 (×6): 300 mg via ORAL
  Filled 2014-10-08 (×7): qty 2

## 2014-10-08 MED ORDER — OXYCODONE HCL 5 MG PO TABS
10.0000 mg | ORAL_TABLET | ORAL | Status: DC | PRN
  Administered 2014-10-08 – 2014-10-10 (×7): 10 mg via ORAL
  Filled 2014-10-08 (×7): qty 2

## 2014-10-08 MED ORDER — ENOXAPARIN SODIUM 40 MG/0.4ML ~~LOC~~ SOLN
40.0000 mg | SUBCUTANEOUS | Status: DC
  Administered 2014-10-08 – 2014-10-09 (×2): 40 mg via SUBCUTANEOUS
  Filled 2014-10-08 (×2): qty 0.4

## 2014-10-08 NOTE — Plan of Care (Signed)
Problem: Discharge Progression Outcomes Goal: Discharge plan in place and appropriate Individualization  Pt prefers to be called Amber Harrison. She lives at home with her husband, followed by Hospice Admitted for Constipation and cellulitis to left labia Hx breast and lung Cancer, HTN, COPD, chronic back pain-controlled with home medications High fall risk. Bed alarm on with hourly rounding. Pt shows understanding of how to utlilize the call system.               Goal: Other Discharge Outcomes/Goals Plan of care progress to goals: 1. Pain temporarily relieved with IV Morphine & scheduled MS Contin  2. Hemodynamically:              -Vitals stable, afebrile, HR elevated 103             -N/V & heart burn relieved by IV Zofran & IV Protonix              -bowel movement during shift  3. Complications: N/V induced after pt took bedtime medications. 4. Tolerating diet: pt states she has not been able to eat much in the past couple of days 5. Activity: +1 assist out of bed and to [facility] due to generalized weakness and back pain No fall or injury this shift. Will continue to assess.

## 2014-10-08 NOTE — Consult Note (Signed)
Gastroenterology Consultation  Referring Provider:     Dr. Darvin Neighbours Primary Care Physician:  Dr. Kingsley Spittle at [facility] Primary Gastroenterologist:  New to Dr. Allen Norris         Reason for Consultation:     Blood in stool  Date of Admission:  10/04/2014 Date of Consultation:  10/08/2014         HPI:   Amber Harrison is a 74 y.o. female admitted with cellulitis of the left labia who has had chronic constipation.  She tells me she's been constipated for at least a year. She is on chronic narcotic medications for her chronic back pain. She was taking OxyContin every 4 hours as well as morphine every 12 hours. She tells me she had went up to 14 days without a bowel movement. She has tried everything at home including milk of magnesia, Senokot, sorbitol, Dulcolax, and MiraLAX without good relief.  Yesterday she was on a bedside commode and had a very large dark stool and noticed a nickel size red blood clot in the stool. She has had a little bit of spotting on her panties since then but denies any further rectal bleeding or melanoma. She is being treated for cellulitis with clindamycin. She denies any NSAIDs. Her last colonoscopy was over 5 years ago by Dr. Tamala Julian and she reports it was normal. She reports her weight has been stable, appetite has been fine. She denies any abdominal pain, nausea, vomiting or history of diarrhea. She denies fever or chills.   CT of abdomen and pelvis with IV contrast shows a large amount stool throughout the colon, circumferential wall sigmoid colon thickening which is nonspecific, distended bladder, cholelithiasis, and a nonspecific 3.6 cm soft tissue mass within the left aspect of the perineum.  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   Past Medical History  Diagnosis Date  . COPD (chronic obstructive pulmonary disease)   . Fibromyalgia   . Migraines   . Hypertension   . High cholesterol   . Heart  attack 2013  . Cancer 2013    lung, breast  . Chronic back pain   . Fracture of lumbar spine     Past Surgical History  Procedure Laterality Date  . Tonsillectomy    . Eye surgery Bilateral   . Mastectomy Bilateral   . Abdominal hysterectomy      total  . Inguinal hernia repair Right   . Kyphoplasty  2012    Prior to Admission medications   Medication Sig Start Date End Date Taking? Authorizing Provider  albuterol (PROVENTIL HFA;VENTOLIN HFA) 108 (90 BASE) MCG/ACT inhaler Inhale 2 puffs into the lungs every 4 (four) hours as needed for wheezing or shortness of breath.   Yes Historical Provider, MD  aspirin 81 MG tablet Take 81 mg by mouth daily.   Yes Historical Provider, MD  cholecalciferol (VITAMIN D) 1000 UNITS tablet Take 1,000 Units by mouth daily.   Yes Historical Provider, MD  Fluticasone Furoate-Vilanterol 100-25 MCG/INH AEPB Inhale 1 puff into the lungs daily.   Yes Historical Provider, MD  furosemide (LASIX) 20 MG tablet Take 20 mg by mouth daily. prn   Yes Historical Provider, MD  guaiFENesin (MUCINEX) 600 MG 12 hr tablet Take 600 mg by mouth 2 (two) times daily.   Yes Historical Provider, MD  lisinopril (PRINIVIL,ZESTRIL) 20 MG tablet Take 20 mg by mouth 2 (two) times daily.   Yes  Historical Provider, MD  magnesium hydroxide (MILK OF MAGNESIA) 400 MG/5ML suspension Take 30 mLs by mouth daily as needed for moderate constipation or indigestion.   Yes Historical Provider, MD  metoprolol succinate (TOPROL-XL) 100 MG 24 hr tablet Take 100 mg by mouth daily. Take with or immediately following a meal.   Yes Historical Provider, MD  morphine (MS CONTIN) 30 MG 12 hr tablet Take 30 mg by mouth every 12 (twelve) hours.   Yes Historical Provider, MD  nitroGLYCERIN (NITROSTAT) 0.4 MG SL tablet Place 0.4 mg under the tongue every 5 (five) minutes as needed for chest pain.   Yes Historical Provider, MD  ondansetron (ZOFRAN) 4 MG tablet Take 4 mg by mouth every 8 (eight) hours as needed  for nausea or vomiting.   Yes Historical Provider, MD  oxyCODONE (OXY IR/ROXICODONE) 5 MG immediate release tablet Take 10 mg by mouth every 4 (four) hours as needed for severe pain.   Yes Historical Provider, MD  predniSONE (DELTASONE) 5 MG tablet Take 5 mg by mouth daily with breakfast.   Yes Historical Provider, MD  senna (SENOKOT) 8.6 MG TABS tablet Take 1 tablet by mouth at bedtime as needed for mild constipation.   Yes Historical Provider, MD  sorbitol 70 % solution Take 30 mLs by mouth 2 (two) times daily. prn   Yes Historical Provider, MD  tiotropium (SPIRIVA) 18 MCG inhalation capsule Place 18 mcg into inhaler and inhale daily.   Yes Historical Provider, MD   History   Social History Narrative   She is married, 3 grown healthy children   Family History  Problem Relation Age of Onset  . CAD Other    There is no known family history of colorectal carcinoma , liver disease, or inflammatory bowel disease.  History  Substance Use Topics  . Smoking status: Current Every Day Smoker -- 0.50 packs/day    Types: Cigarettes  . Smokeless tobacco: Not on file  . Alcohol Use: No   Allergies as of 10/04/2014 - Review Complete 10/04/2014  Allergen Reaction Noted  . Macrobid [nitrofurantoin monohyd macro]  10/04/2014    Review of Systems:    All systems reviewed and negative except where noted in HPI.   Physical Exam:  Vital signs in last 24 hours: Temp:  [98.5 F (36.9 C)-99.4 F (37.4 C)] 98.5 F (36.9 C) (05/26 1232) Pulse Rate:  [97-105] 97 (05/26 1232) Resp:  [16] 16 (05/26 0450) BP: (92-134)/(44-79) 109/61 mmHg (05/26 1232) SpO2:  [94 %-98 %] 96 % (05/26 1232) Last BM Date: 10/08/14 General:   Alert,  Well-developed, thin, pleasant and cooperative in NAD, daughter is at the bedside. Head:  Normocephalic and atraumatic. Eyes:  Sclera clear, no icterus.   Conjunctiva pink. Ears:  Normal auditory acuity. Nose:  No deformity, discharge, or lesions. Mouth:  No deformity or  lesions,oropharynx pink & moist. Neck:  Supple; no masses or thyromegaly. Lungs:  Respirations even and unlabored.  Clear throughout to auscultation.   No wheezes, crackles, or rhonchi. No acute distress. Heart:  Regular rate and rhythm; no murmurs, clicks, rubs, or gallops. Abdomen:  Normal bowel sounds.  No bruits.  Soft, non-tender and non-distended without masses, hepatosplenomegaly or hernias noted.  No guarding or rebound tenderness.     Rectal:  Left labia/perineum with large extending palpable bulging mass that extends to anus. There is dried blood at anus without active bleeding. Internal exam was not performed given significant tenderness. Msk:  Symmetrical without gross deformities.  Good,  equal movement & strength bilaterally. Pulses:  Normal pulses noted. Extremities:  No clubbing or edema.  No cyanosis. Neurologic:  Alert and oriented x3;  grossly normal neurologically. Skin:  Intact without significant lesions or rashes.  No jaundice. Lymph Nodes:  No significant cervical adenopathy. Psych:  Alert and cooperative. Normal mood and affect.  LAB RESULTS:  Recent Labs  10/06/14 0446 10/08/14 1258  WBC 12.4* 7.8  HGB 14.0 12.9  HCT 42.5 38.8  PLT 286 285   BMET  Recent Labs  10/07/14 0348  NA 134*  K 3.4*  CL 95*  CO2 32  GLUCOSE 91  BUN 7  CREATININE 0.45  CALCIUM 8.0*   STUDIES: Dg Abd 2 Views  10/07/2014   CLINICAL DATA:  Constipation, abdominal pain.  EXAM: ABDOMEN - 2 VIEW  COMPARISON:  CT abdomen pelvis 10/04/2014.  FINDINGS: Fair amount of stool and retained contrast in the colon. Mild colonic gaseous distention. No definite small bowel dilatation. Visualized lung bases show patchy right perihilar opacity and small right pleural effusion. Thoracolumbar vertebral body augmentations. Osteopenia.  IMPRESSION: 1. Mild gaseous distention of colon with retained stool and oral contrast. A mild colonic ileus can have this appearance. No definite small bowel  obstruction. 2. Right perihilar opacity and small right pleural effusion are poorly evaluated due to incomplete visualization. Consider PA and lateral views of the chest in further evaluation.   Electronically Signed   By: Lorin Picket M.D.   On: 10/07/2014 12:34      Impression / Plan:   PAMULA LUTHER is a 74 y.o. y/o female with left labial abscess/cellulitis that extends to anus. She is on antibiotic therapy currently. She has chronic narcotic-induced constipation that has responded to Relistor. She has rectal bleeding and has a normal hemoglobin. Given the risk of perforation of her labial/perineal abscess, she should complete treatment of antibiotic's prior to consideration of outpatient colonoscopy.  I have discussed her care with Dr. Lucilla Lame.  Plan: #1 continue supportive measures for constipation #2 she should go home on Colace 100 mg twice a day #3 she should be discharged on Amitiza 24 g twice daily with food for chronic narcotic-induced constipation #4 outpatient follow-up with [place] in 2-3 weeks for consideration of colonoscopy once abscess resolves Thank you for involving me in the care of this patient.      LOS: 4 days   Vickey Huger, NP  10/08/2014, 3:26 PM [facility]  [address], [place] [address] Phone: [phone] Fax : [phone]

## 2014-10-08 NOTE — Plan of Care (Signed)
Problem: Discharge Progression Outcomes Goal: Discharge plan in place and appropriate Individualization  Outcome: Progressing Pt prefers to be called Amber Harrison. She lives at home with her husband, followed by Hospice Admitted for Constipation and cellulitis to left labia Hx breast and lung Cancer, HTN, COPD, chronic back pain-controlled with home medications High fall risk. Bed alarm on with hourly rounding. Pt shows understanding of how to utilize the call system.   Goal: Other Discharge Outcomes/Goals Outcome: Progressing Plan of care progress to goals: 1. Morphine oral, morphine IV and oxycodone given for pain relief. Improvement noted. 2. Hemodynamically:               -Afebrile. Labs improving.              -Protonix  oral.  3.. Tolerating Healthy Heart diet. Increased appetite. Ensure supplement provided. 4. Activity: 1 person assist to [facility].       No fall or injuries this shift.

## 2014-10-08 NOTE — Progress Notes (Addendum)
Visit made. Patient seen sitting up in bed, having just had a bed bath. She reports feeling "better", daughter Judeen Hammans at bedside. Per chart review patient has not needed any further antiemetic, has received 2 doses of IV morphine since 4 am this morning as well as her scheduled dose of MS contin 30 mg. She also ate 100% of breakfast this morning and has had several bowel movements, some blood seen per staff. She continues on IV antibiotics for treatment of cellulitis to her left labia.  Of note patient reports that prior to admission she had been changed from Protonix to omeprazole due to insurance. She stated that the 20 mg of omeprazole was not effective and that her physician had increased it to 40 mg daily. She is currently receiving IV Protonix this hospitalization, which was started on 5/25. Emotional support offered, will continue to follow and update hospice team.  Flo Shanks RN, BSN, [place] [facility] [place], [facility] [phone] c

## 2014-10-08 NOTE — Progress Notes (Signed)
[facility] NAME: Amber Harrison    MR#:  [id number]  DATE OF BIRTH:  [birth year]  SUBJECTIVE:  CHIEF COMPLAINT:  Had 2 BMs overnight. With some blood. Nausea better but present.  Chief Complaint  Patient presents with  . Constipation  . Vaginal Pain    REVIEW OF SYSTEMS:  CONSTITUTIONAL: No fever or chills, but has generalized weakness.  EYES: No blurred or double vision.  EARS, NOSE, AND THROAT: No tinnitus or ear pain.  RESPIRATORY: No cough, mild shortness of breath, no wheezing or hemoptysis.  CARDIOVASCULAR: No chest pain, orthopnea, edema.  GASTROINTESTINAL: No nausea, vomiting, diarrhea. Constipation. Abdominal pain. GENITOURINARY: Has dysuria, no hematuria.  ENDOCRINE: No polyuria, nocturia,  HEMATOLOGY: No anemia, easy bruising or bleeding SKIN: No rash or lesion. Cellulitis perenium. MUSCULOSKELETAL: No joint pain or arthritis.   NEUROLOGIC: No tingling, numbness, weakness.  PSYCHIATRY: No anxiety or depression.   DRUG ALLERGIES:   Allergies  Allergen Reactions  . Macrobid [Nitrofurantoin Monohyd Macro]     VITALS:  Blood pressure 92/44, pulse 102, temperature 98.5 F (36.9 C), temperature source Oral, resp. rate 16, height 5' (1.524 m), weight 47.174 kg (104 lb), SpO2 95 %.  PHYSICAL EXAMINATION:  GENERAL:  74 y.o.-year-old patient lying in the bed with no acute distress.  EYES: Pupils equal, round, reactive to light and accommodation. No scleral icterus. Extraocular muscles intact.  HEENT: Head atraumatic, normocephalic. Oropharynx and nasopharynx clear.  NECK:  Supple, no jugular venous distention. No thyroid enlargement, no tenderness.  LUNGS: Normal breath sounds bilaterally, no wheezing, rales,rhonchi or crepitation. No use of accessory muscles of respiration.  CARDIOVASCULAR: S1, S2 normal. No murmurs, rubs, or gallops.  ABDOMEN: Soft. Bowel sounds present. No organomegaly or mass. Tender.  Distended. EXTREMITIES: No pedal edema, cyanosis, or clubbing.  NEUROLOGIC: Cranial nerves II through XII are intact. Muscle strength 5/5 in all extremities. Sensation intact. Gait not checked.  PSYCHIATRIC: The patient is alert and oriented x 3.  SKIN: No obvious rash, lesion, or ulcer.  Left labia majora, erythema, tender.   LABORATORY PANEL:   CBC  Recent Labs Lab 10/06/14 0446  WBC 12.4*  HGB 14.0  HCT 42.5  PLT 286   ------------------------------------------------------------------------------------------------------------------  Chemistries   Recent Labs Lab 10/04/14 1558 10/07/14 0348  NA 133* 134*  K 3.5 3.4*  CL 91* 95*  CO2 35* 32  GLUCOSE 91 91  BUN 8 7  CREATININE 0.57 0.45  CALCIUM 8.3* 8.0*  AST 32  --   ALT 19  --   ALKPHOS 87  --   BILITOT 0.9  --    ------------------------------------------------------------------------------------------------------------------  Cardiac Enzymes No results for input(s): TROPONINI in the last 168 hours. ------------------------------------------------------------------------------------------------------------------  RADIOLOGY:  Dg Abd 2 Views  10/07/2014   CLINICAL DATA:  Constipation, abdominal pain.  EXAM: ABDOMEN - 2 VIEW  COMPARISON:  CT abdomen pelvis 10/04/2014.  FINDINGS: Fair amount of stool and retained contrast in the colon. Mild colonic gaseous distention. No definite small bowel dilatation. Visualized lung bases show patchy right perihilar opacity and small right pleural effusion. Thoracolumbar vertebral body augmentations. Osteopenia.  IMPRESSION: 1. Mild gaseous distention of colon with retained stool and oral contrast. A mild colonic ileus can have this appearance. No definite small bowel obstruction. 2. Right perihilar opacity and small right pleural effusion are poorly evaluated due to incomplete visualization. Consider PA and lateral views of the chest in further evaluation.   Electronically Signed  By: Lorin Picket M.D.   On: 10/07/2014 12:34    EKG:  No orders found for this or any previous visit.  ASSESSMENT AND PLAN:  74 year old Caucasian female history of COPD, oxygen dependent presenting with constipation of pelvic pain.  * Constipation: Continue bowel regimen. Have tried multiple agent including enema. Will get an xray to r/o ileus or obstruction. Will give Relistor if xay shows nothing acute. Cause of abdominal pain Bowel wall thickening on CT. Colitis? Now has some blood in stool. Likely from her constipation but will consult GI for further input.  * Sepsis with cellulitis in her left labia. Neg blood culture and better leukocytosis.  Broad-spectrum antibiotics including vancomycin/Zosyn was discontinued and on clindamycin now.  * Hypertension essential: on metoprolol and lisinopril.  * Chronic back pain- Same  * Hyponatremia. Mild. Asymptomatic.  * COPD. Stable. Continue patient home nebulizer treatment.   Discussed in detail with patient and family. Reviewed old records and answered all questions.  All the records are reviewed and case discussed with Care Management/Social Workerr. Management plans discussed with the patient, the patient's daughter and they are in agreement.  CODE STATUS: DO NOT RESUSCITATE   TOTAL TIME TAKING CARE OF THIS PATIENT: 40 minutes.   Likely d/c in AM    [name] R M.D on 10/08/2014 at 11:38 AM  Between 7am to 6pm - Pager - [phone]  After 6pm go to [email or web address] - password [email or web address] [facility]  [facility]  Office  [phone]  CC: Primary care physician; No primary care provider on file.

## 2014-10-09 LAB — CULTURE, BLOOD (ROUTINE X 2)
Culture: NO GROWTH
Culture: NO GROWTH

## 2014-10-09 MED ORDER — POTASSIUM CHLORIDE 20 MEQ PO PACK
40.0000 meq | PACK | Freq: Once | ORAL | Status: AC
  Administered 2014-10-09: 40 meq via ORAL
  Filled 2014-10-09: qty 2

## 2014-10-09 MED ORDER — LACTULOSE 10 GM/15ML PO SOLN
30.0000 g | Freq: Once | ORAL | Status: AC
  Administered 2014-10-09: 30 g via ORAL
  Filled 2014-10-09: qty 60

## 2014-10-09 MED ORDER — LUBIPROSTONE 24 MCG PO CAPS
24.0000 ug | ORAL_CAPSULE | Freq: Two times a day (BID) | ORAL | Status: DC
  Administered 2014-10-09 – 2014-10-10 (×3): 24 ug via ORAL
  Filled 2014-10-09 (×5): qty 1

## 2014-10-09 NOTE — Progress Notes (Signed)
Nutrition Follow-up  DOCUMENTATION CODES:     INTERVENTION: Meals and Snacks: Cater to patient preferences Medical Food Supplement Therapy: will discontinue Ensure Enlive (each supplement provides 350kcal and 20 grams of protein) as pt does not like and is not drinking. Will send Mighty Shakes TID and Magic Cup daily instead.  NUTRITION DIAGNOSIS:  Inadequate oral intake related to acute illness as evidenced by per patient/family report.  GOAL:  Patient will meet greater than or equal to 90% of their needs; ongoing  MONITOR:   (Energy Intake, Supplement Acceptance, Electrolyte and renal Profile, Anthropometrics)  REASON FOR ASSESSMENT:  Other (Comment) (RD follow Up)    ASSESSMENT:  Per MD note, pt constipation improved; pt may need I and D of abscess, OBGYN following.  PO Intake: pt reports not wanting lunch today did not have an appetite, however reports eating breakfast well this am. Per I and O chart, pt ate 100% of 2 meals yesterday. RD delivered an ice cream to pt on visit.  Medications: Senna, Colace, vitamin D, Protonix, Prednisone Labs:  Electrolyte and Renal Profile:    Recent Labs Lab 10/04/14 1558 10/07/14 0348  BUN 8 7  CREATININE 0.57 0.45  NA 133* 134*  K 3.5 3.4*   Glucose Profile: No results for input(s): GLUCAP in the last 72 hours. Protein Profile:  Recent Labs Lab 10/04/14 1558  ALBUMIN 3.0*   Filed Weights   10/04/14 1524 10/04/14 2239  Weight: 92 lb (41.731 kg) 104 lb (47.174 kg)   BMI:  Body mass index is 20.31 kg/(m^2).  Estimated Nutritional Needs:  Kcal:  248 224 9640 kcal/ day (BEE: 830 x 1.2 AF x 1.0-1.2 IF)  Protein:  50-59 g Pro/day (1.2-1.4 g Pro/ kg/ day)  Fluid:  1045-1255 ml/ day (25-30 ml/kg)  Skin:  Reviewed, no issues  Diet Order:  Diet regular Room service appropriate?: Yes; Fluid consistency:: Thin  EDUCATION NEEDS:  No education needs identified at this time   Intake/Output Summary (Last 24 hours) at  10/09/14 1431 Last data filed at 10/09/14 1200  Gross per 24 hour  Intake    120 ml  Output    650 ml  Net   -530 ml    Last BM:  5/26  LOW Care Level   Dwyane Luo, RD, LDN Pager [phone]

## 2014-10-09 NOTE — Consult Note (Signed)
Consult History and Physical   SERVICE: Gynecology  Patient Name: Amber Harrison Patient MRN:   [id number]  CC: back pain, constipation, urinary difficulty, vaginal/vulvar pain   HPI: Amber Harrison is a 74 y.o. who had a hysterectomy many years ago.  She presented to the ED with severe back pain, and was found to have constipation.  In addition she has had difficulty urinating, feeling as though she has the urge to go, but when she sits she cannot pass the urine unless she sits for a very long time.  She also had pain in her left labial area and was found to have a cellulitis for which she was treated with broad spectrum antibiotics then narrowed to clindamycin. She continues to have pain in that area.  On admission she carried the diagnosis of sepsis, however she has never been febrile, (Tmax 99.4), and has not had compromise of any organ system at this time.  A CT scan was performed which identified a 3cm left labial collection, likely abscess, with concern for compression of the urethra,   Please see her past medical history below for historical data.     Review of Systems: positives in bold GEN:   fevers, chills, weight changes, appetite changes, fatigue, night sweats HEENT:  HA, vision changes, hearing loss, congestion, rhinorrhea, sinus pressure, dysphagia CV:   CP, palpitations PULM:  SOB, cough GI:  abd pain, N/V/D/C GU:  dysuria, urgency, frequency MSK:  arthralgias, myalgias, back pain, swelling SKIN:  rashes, color changes, pallor NEURO:  numbness, weakness, tingling, seizures, dizziness, tremors PSYCH:  depression, anxiety, behavioral problems, confusion  HEME/LYMPH:  easy bruising or bleeding ENDO:  heat/cold intolerance  Past Obstetrical History: OB History    No data available      Past Gynecologic History: No LMP recorded. Patient has had a hysterectomy.    Past Medical History: Past Medical History  Diagnosis Date  . COPD (chronic obstructive pulmonary  disease)   . Fibromyalgia   . Migraines   . Hypertension   . High cholesterol   . Heart attack 2013  . Cancer 2013    lung, breast  . Chronic back pain   . Fracture of lumbar spine     Past Surgical History:   Past Surgical History  Procedure Laterality Date  . Tonsillectomy    . Eye surgery Bilateral   . Mastectomy Bilateral   . Abdominal hysterectomy      total  . Inguinal hernia repair Right   . Kyphoplasty  2012    Family History:  family history includes CAD in her other.  Social History:  History   Social History  . Marital Status: Married    Spouse Name: N/A  . Number of Children: 3  . Years of Education: N/A   Occupational History  . Retired Systems developer    Social History Main Topics  . Smoking status: Current Every Day Smoker -- 0.50 packs/day    Types: Cigarettes  . Smokeless tobacco: Not on file  . Alcohol Use: No  . Drug Use: No  . Sexual Activity: Not on file   Other Topics Concern  . Not on file   Social History Narrative   She is married, 3 grown healthy children    Home Medications:  Medications reconciled in EPIC  No current facility-administered medications on file prior to encounter.   No current outpatient prescriptions on file prior to encounter.    Allergies:  Allergies  Allergen Reactions  .  Macrobid [Nitrofurantoin Monohyd Macro]     Physical Exam:  Temp:  [98.5 F (36.9 C)-98.8 F (37.1 C)] 98.5 F (36.9 C) (05/27 0824) Pulse Rate:  [93-103] 103 (05/27 0824) Resp:  [18] 18 (05/27 0824) BP: (94-138)/(58-68) 138/68 mmHg (05/27 0824) SpO2:  [96 %-100 %] 100 % (05/27 0824)   General Appearance:  Thin, fraile, looks stated age. no acute distress, alert and oriented x3 Cardiovascular:  RRR nor MRG Pulmonary:  clear to auscultation, no wheezes, rales or rhonchi, symmetric air entry, good air exchange  With O2 via nasal cannula Abdomen:  Bowel sounds present, soft, nontender, nondistended, no abnormal  masses, no epigastric pain Extremities: no pedal edema, 2+ distal pulses, no tenderness Psychiatric:  Normal mood and affect, appropriate Pelvic: external: erythema of the bilateral labia, normal for age.  Right labia and inner vagina normal.  Left labia has a discreet area of fullness that is consistent with the imaging, not entirely consistent with a bartholin's gland abscess, but in that general area.  Extends to posterior fourchette, but not involving anus or rectum.  Internal vaginal walls are normal.  Labs/Studies:   CBC and Coags:  Lab Results  Component Value Date   WBC 7.8 10/08/2014   NEUTOPHILPCT 77 10/08/2014   EOSPCT 0 10/08/2014   BASOPCT 0 10/08/2014   LYMPHOPCT 7 10/08/2014   HGB 12.9 10/08/2014   HCT 38.8 10/08/2014   MCV 101.3* 10/08/2014   PLT 285 10/08/2014   CMP:  Lab Results  Component Value Date   NA 134* 10/07/2014   K 3.4* 10/07/2014   CL 95* 10/07/2014   CO2 32 10/07/2014   BUN 7 10/07/2014   CREATININE 0.45 10/07/2014   CREATININE 0.57 10/04/2014   CREATININE 0.74 03/23/2014   PROT 5.8* 10/04/2014   BILITOT 0.9 10/04/2014   ALT 19 10/04/2014   AST 32 10/04/2014   ALKPHOS 87 10/04/2014    Other Imaging: Dg Abd 1 View  10/04/2014   CLINICAL DATA:  Constipation  EXAM: ABDOMEN - 1 VIEW  COMPARISON:  09/18/2014  FINDINGS: Scattered large and small bowel gas is noted. Postsurgical changes are seen. Multilevel vertebral augmentation is noted. Diffuse aortic calcifications are seen. No free air is noted.  IMPRESSION: Nonspecific abdomen.   Electronically Signed   By: Inez Catalina M.D.   On: 10/04/2014 16:25   Ct Abdomen Pelvis W Contrast  10/04/2014   CLINICAL DATA:  Patient with constipation for 10 days. Perineal edema and swelling for 1 day. History of lung cancer.  EXAM: CT ABDOMEN AND PELVIS WITH CONTRAST  TECHNIQUE: Multidetector CT imaging of the abdomen and pelvis was performed using the standard protocol following bolus administration of  intravenous contrast.  CONTRAST:  66m OMNIPAQUE IOHEXOL 300 MG/ML  SOLN  COMPARISON:  CT 11/11/2013; 07/01/2012.  FINDINGS: Lower chest: Scarring and or atelectasis within the bilateral lower lobes. Heart is enlarged. 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  Hepatobiliary: Liver is normal in size and contour. Cholelithiasis. No surrounding inflammatory stranding to suggest acute cholecystitis. Stable mild intrahepatic biliary ductal dilatation. Common bile duct is unremarkable. Stable 11 mm low-attenuation lesion within the right hepatic lobe (image 30; series 2).  Pancreas: Unremarkable  Spleen: Unremarkable  Adrenals/Urinary Tract: Normal adrenal glands. Kidneys enhance symmetrically with contrast. Stable sub cm low-attenuation lesion interpolar region left kidney, too small to characterize. Right extrarenal pelvis. Urinary bladder is markedly distended.  Stomach/Bowel: Large amount of stool throughout the colon, large amount of  stool throughout the colon. No evidence for bowel obstruction. No free fluid or free intraperitoneal air. There is circumferential wall thickening of the sigmoid colon (image 51; series 2).  Vascular/Lymphatic: Marked calcification of the abdominal aorta which is tortuous. No retroperitoneal lymphadenopathy.  Other: There is a nonspecific 3.6 x 3.2 cm soft tissue mass within the left aspect of the perineum (image 72; series 2). Cystocele.  Musculoskeletal: Re- demonstrated kyphoplasty within the L4, L2, L1 and T12 vertebral bodies.  IMPRESSION: 1. Large amount of stool throughout the colon as can be seen with constipation. No evidence for bowel obstruction. 2. Circumferential wall thickening of the sigmoid colon within the pelvis is nonspecific and may be secondary to underdistention, focal colitis or potentially mass at this location. Consider correlation with colonoscopy as clinically indicated. 3. Urinary bladder is markedly distended.  4. Nonspecific 3.6 cm soft tissue mass within the left aspect of the perineum, recommend correlation with direct visualization. 5. Cholelithiasis. 6. No significant interval change in diameter of descending thoracic aortic aneurysm.   Electronically Signed   By: Lovey Newcomer M.D.   On: 10/04/2014 19:41   Dg Abd 2 Views  10/07/2014   CLINICAL DATA:  Constipation, abdominal pain.  EXAM: ABDOMEN - 2 VIEW  COMPARISON:  CT abdomen pelvis 10/04/2014.  FINDINGS: Fair amount of stool and retained contrast in the colon. Mild colonic gaseous distention. No definite small bowel dilatation. Visualized lung bases show patchy right perihilar opacity and small right pleural effusion. Thoracolumbar vertebral body augmentations. Osteopenia.  IMPRESSION: 1. Mild gaseous distention of colon with retained stool and oral contrast. A mild colonic ileus can have this appearance. No definite small bowel obstruction. 2. Right perihilar opacity and small right pleural effusion are poorly evaluated due to incomplete visualization. Consider PA and lateral views of the chest in further evaluation.   Electronically Signed   By: Lorin Picket M.D.   On: 10/07/2014 12:34     Assessment / Plan:   DELCIA SPITZLEY is a 74 y.o. No obstetric history on file. who presents with labial abscess  1. Urinary retention:  i do not believe this labial process is causing the urinary retention.  There is minimal swelling around the urethra and the mass is distant from it.  Foley inserted to help relieve some back pain, 200cc evacuated, will remain throughout the day, to be removed and voiding trial to be performed before discharge.   2. Perineal abscess?  Likely given imaging and physical findings.  Could be drained at bedside if patient was able to be in position for some time and sterile technique could be performed.  If not, would need to come to the OR for I&D.  Patient cannot do general anesthesia due to poor lung function.  3. Constipation -  not sure etiology of this, but current regimens seem to be working.  Continue per primary team.  4. Antibiotics - continue, although be attentive to clindamycin and the association with c.diff.  Consider other coverage like PO bacrtim or a broad spectrum cephalosporin.    Thank you for the opportunity to be involved with this patient's care.   Larey Days, MD [facility]

## 2014-10-09 NOTE — Progress Notes (Signed)
Visit made. Per chart review patient has had both GI and OB consult. GI is recommending a colonoscopy as an outpatient, after her perineal abscess/cellulitis has resolved as well as starting Amitiza 24 mg twice daily for chronic narcotic induced constipation. Colonoscopy is for a follow up related to some blood seen in her stools. Patient seen lying in bed, Physical Therapist present assisting patient into a new pull up. Per patient and PT report, a foley had just been placed by OB NP, clear yellow urine draining. Foley placed d/t continued urinary retention.  Per chart notes she required an in and out catheterization over night after a bladder scan showed 945 ml present. Also per patient report OB NP is deciding on whether to lance the abscess or let it resolve with antibiotic therapy. Patient reported pain to her perineal area, Staff RN Daleen Snook to follow up with PRN pain medication.  Per [facility] and Staff RN, patient may discharge later today. Will continue to follow and update hospice team.  Flo Shanks RN, BSN, [place] [facility] [place], [facility] [phone]

## 2014-10-09 NOTE — Progress Notes (Addendum)
   Subjective: Patient has some abdominal discomfort 7/10.  She has some nausea without vomiting.  She has no further rectal bleeding.   Objective: Vital signs in last 24 hours: Temp:  [98.5 F (36.9 C)-98.8 F (37.1 C)] 98.5 F (36.9 C) (05/27 0824) Pulse Rate:  [93-103] 103 (05/27 0824) Resp:  [18] 18 (05/27 0824) BP: (94-138)/(58-68) 138/68 mmHg (05/27 0824) SpO2:  [96 %-100 %] 100 % (05/27 0824) Last BM Date: 10/08/14 No LMP recorded. Patient has had a hysterectomy. Body mass index is 20.31 kg/(m^2). General:   Alert,  Well-developed, well-nourished, pleasant and cooperative in NAD Head:  Normocephalic and atraumatic. Eyes:  Sclera clear, no icterus.   Conjunctiva pink. Mouth:  No deformity or lesions, oropharynx pink & moist. Neck:  Supple; no masses or thyromegaly. Heart:  Regular rate and rhythm; no murmurs, clicks, rubs, or gallops. Abdomen:   Normal bowel sounds.  Soft, nontender and nondistended. No masses, hepatosplenomegaly or hernias noted.  No guarding or rebound tenderness.   Msk:  Symmetrical without gross deformities. Good equal movement & strength bilaterally. Pulses:  Normal pulses noted. Extremities:  Without clubbing or edema.  No cyanosis Neurologic:  Alert and  oriented x3;  grossly normal neurologically. Skin:  Intact without significant lesions or rashes. Cervical Nodes:  No significant cervical adenopathy. Psych:  Alert and cooperative. Normal mood and affect.  Intake/Output from previous day: 05/26 0701 - 05/27 0700 In: 480 [P.O.:480] Out: 650 [Urine:650]  Lab Results:  Recent Labs  10/08/14 1258  WBC 7.8  HGB 12.9  HCT 38.8  PLT 285   BMET  Recent Labs  10-09-14 0348  NA 134*  K 3.4*  CL 95*  CO2 32  GLUCOSE 91  BUN 7  CREATININE 0.45  CALCIUM 8.0*   Studies/Results: Dg Abd 2 Views  Oct 09, 2014   CLINICAL DATA:  Constipation, abdominal pain.  EXAM: ABDOMEN - 2 VIEW  COMPARISON:  CT abdomen pelvis 10/04/2014.  FINDINGS: Fair  amount of stool and retained contrast in the colon. Mild colonic gaseous distention. No definite small bowel dilatation. Visualized lung bases show patchy right perihilar opacity and small right pleural effusion. Thoracolumbar vertebral body augmentations. Osteopenia.  IMPRESSION: 1. Mild gaseous distention of colon with retained stool and oral contrast. A mild colonic ileus can have this appearance. No definite small bowel obstruction. 2. Right perihilar opacity and small right pleural effusion are poorly evaluated due to incomplete visualization. Consider PA and lateral views of the chest in further evaluation.   Electronically Signed   By: Lorin Picket M.D.   On: Oct 09, 2014 12:34    Assessment: Rectal bleeding:  Large left labial/perineal abscess/cellulitis that extends to anus should be definitively managed prior to colonosocpy. GYN is on case.   Chronic constipation:  Improved  Plan: #1 continue supportive measures for constipation #2 she should go home on Colace 100 mg twice a day #3 she should be discharged on Amitiza 24 g twice daily with food for chronic narcotic-induced constipation #4 outpatient follow-up with [place] in 2-3 weeks for consideration of colonoscopy once abscess resolves Will sign off.  Call if any further problems or questions.  We would like to thank you for the opportunity to participate in the care of Amber Harrison.   LOS: 5 days  Vickey Huger  10/09/2014, 10:09 AM [facility]  [address], [place] [address] Phone: [phone] Fax : [phone]

## 2014-10-09 NOTE — Evaluation (Signed)
Physical Therapy Evaluation Patient Details Name: Amber Harrison MRN: [id number] DOB: May 02, 1941 Today's Date: 10/09/2014   History of Present Illness  presented to ER secondary to constipation, nausea, abdominal distension/pain; admitted with sepsis related to labial cellulitis/abscess.  Clinical Impression  Upon evaluation, patient alert and oriented, follows all commands and demonstrates good safety awareness/insight.  UE/LE strength and ROM grossly WFL for basic transfers and mobility. Patient movement very slow and guarded due to pain in low back and peri-area.  Requires min assist to complete supine/sit; unable to complete additional mobility assessment due to GYN/GI consults in during PT session (placed foley and completed full exams). Patient unable to tolerate additional mobility afterwards due to pain. Will complete additional mobility assessment in subsequent sessions as patient able to tolerate.    Follow Up Recommendations  (anticipate appropriate for home with HHPT; will formally complete recommendations pending full mobility assessment)    Equipment Recommendations  Rolling walker with 5" wheels    Recommendations for Other Services       Precautions / Restrictions Precautions Precautions: Fall Restrictions Weight Bearing Restrictions: No      Mobility  Bed Mobility Overal bed mobility: Needs Assistance Bed Mobility: Supine to Sit;Sit to Supine     Supine to sit: Min assist Sit to supine: Modified independent (Device/Increase time)   General bed mobility comments: increased time to complete due to pain in low back, peri-area with movement transition. Unsupported sitting balance, mod indep  Transfers                 General transfer comment: unable to complete; GYN/GI in for consults, patient unable to tolerate additional activity after consults  Ambulation/Gait             General Gait Details: unable to complete; GYN/GI in for consults, patient  unable to tolerate additional activity after consults  Stairs            Wheelchair Mobility    Modified Rankin (Stroke Patients Only)       Balance                                             Pertinent Vitals/Pain Pain Assessment: 0-10 Pain Score: 7  Pain Location: abdomen, peri-area Pain Intervention(s): Limited activity within patient's tolerance;Premedicated before session;Repositioned    Home Living Family/patient expects to be discharged to:: Private residence Living Arrangements: Spouse/significant other Available Help at Discharge: Family Type of Home: House Home Access: Stairs to enter Entrance Stairs-Rails: Right Entrance Stairs-Number of Steps: 2 Home Layout: One level Home Equipment: Environmental consultant - 2 wheels;Cane - single point      Prior Function Level of Independence: Independent with assistive device(s)  Was followed by hospice services for COPD diagnosis       Comments: Mod indep for limited household distances with RW vs. SPC "as needed"; home O2 "as needed"     Hand Dominance        Extremity/Trunk Assessment   Upper Extremity Assessment: Generalized weakness (ROM WFL for transfers/mobility; strength at least 3+/5)           Lower Extremity Assessment: Generalized weakness (ROM WFL for transfers/mobility; strength at least 3+/5)         Communication   Communication: No difficulties  Cognition Arousal/Alertness: Awake/alert Behavior During Therapy: WFL for tasks assessed/performed Overall Cognitive Status: Within Functional Limits for tasks  assessed                      General Comments      Exercises        Assessment/Plan    PT Assessment Patient needs continued PT services  PT Diagnosis Difficulty walking;Generalized weakness   PT Problem List Decreased strength;Decreased range of motion;Decreased activity tolerance;Decreased balance;Decreased mobility;Decreased safety awareness;Decreased  knowledge of precautions;Pain  PT Treatment Interventions DME instruction;Gait training;Stair training;Functional mobility training;Therapeutic activities;Therapeutic exercise;Balance training;Patient/family education   PT Goals (Current goals can be found in the Care Plan section) Acute Rehab PT Goals Patient Stated Goal: "to get better" PT Goal Formulation: With patient Time For Goal Achievement: 10/23/14 Potential to Achieve Goals: Good    Frequency Min 2X/week   Barriers to discharge Decreased caregiver support      Co-evaluation               End of Session   Activity Tolerance: Patient limited by pain Patient left: in bed;with call bell/phone within reach;with bed alarm set           Time: 3779-3968 PT Time Calculation (min) (ACUTE ONLY): 15 min   Charges:   PT Evaluation $Initial PT Evaluation Tier I: 1 Procedure     PT G Codes:       Yoniel Arkwright H. Owens Shark, PT, DPT 10/09/2014, 3:14 PM [place] 10/09/2014, 3:12 PM

## 2014-10-09 NOTE — Plan of Care (Signed)
Problem: Discharge Progression Outcomes Goal: Discharge plan in place and appropriate Individualization  Pt prefers to be called Amber Harrison. She lives at home with her husband, followed by Hospice Admitted for Constipation and cellulitis to left labia Hx breast and lung Cancer, HTN, COPD, chronic back pain-controlled with home medications High fall risk. Bed alarm on with hourly rounding. Pt shows understanding of how to utilize the call system.       Goal: Other Discharge Outcomes/Goals Plan of care progress to goals: 1. Morphine PO & oxycodone given once each with temporary relief noted.  2. Hemodynamically:                -vitals stable. Afebrile. Labs improving.               -bowel movements continue, some blood still noted in stool  3. Complication: pt unable to void during the night, bladder scan showed 93m, in/out cath removed 6587m4.. Tolerating Healthy Heart diet. Increased appetite pt states due to prednisone. 5. [facility] No fall or injuries this shift. Will continue to assess.

## 2014-10-09 NOTE — Plan of Care (Signed)
Problem: Discharge Progression Outcomes Goal: Discharge plan in place and appropriate Individualization  Outcome: Progressing Pt prefers to be called Amber Harrison. She lives at home with her husband, followed by Hospice Admitted for Constipation and cellulitis to left labia. Hx breast and lung Cancer, HTN, COPD, chronic back pain-controlled with home medications High fall risk. Bed alarm on with hourly rounding. Pt shows understanding of how to utilize the call system.    Goal: Other Discharge Outcomes/Goals Outcome: Progressing Plan of care progress to goals: 1. Oxycodone given for pain relief. Improvement noted. 2. Hemodynamically:                -Afebrile. Labs improving.               -Protonix oral.               -Zofran given for nausea. No emesis. Improvement noted. 3. Tolerating Healthy Heart diet. Increased appetite. Ensure supplement provided. Magic cup and might       shake. 4. Activity: 1 person assist to [facility].        No fall or injuries this shift.

## 2014-10-09 NOTE — Progress Notes (Signed)
[place] [facility] [place] NAME: Amber Harrison    MR#:  [id number]  DATE OF BIRTH:  October 19, 1940  SUBJECTIVE:  CHIEF COMPLAINT:   Chief Complaint  Patient presents with  . Constipation  . Vaginal Pain   Constipation still present but better after 2 BMs she had in hospital. Urinary retention. Had to be straight cathed with 975 ml urine in bladder. REVIEW OF SYSTEMS:  CONSTITUTIONAL: No fever or chills, but has generalized weakness.  EYES: No blurred or double vision.  EARS, NOSE, AND THROAT: No tinnitus or ear pain.  RESPIRATORY: No cough, mild shortness of breath, no wheezing or hemoptysis.  CARDIOVASCULAR: No chest pain, orthopnea, edema.  GASTROINTESTINAL: No nausea, vomiting, diarrhea. Constipation. Abdominal pain. GENITOURINARY: Has dysuria, no hematuria.  ENDOCRINE: No polyuria, nocturia,  HEMATOLOGY: No anemia, easy bruising or bleeding SKIN: No rash or lesion. Cellulitis perenium. MUSCULOSKELETAL: No joint pain or arthritis.   NEUROLOGIC: No tingling, numbness, weakness.  PSYCHIATRY: No anxiety or depression.   DRUG ALLERGIES:   Allergies  Allergen Reactions  . Macrobid [Nitrofurantoin Monohyd Macro]     VITALS:  Blood pressure 138/68, pulse 103, temperature 98.5 F (36.9 C), temperature source Oral, resp. rate 18, height 5' (1.524 m), weight 47.174 kg (104 lb), SpO2 100 %.  PHYSICAL EXAMINATION:  GENERAL:  74 y.o.-year-old patient lying in the bed with no acute distress.  EYES: Pupils equal, round, reactive to light and accommodation. No scleral icterus. Extraocular muscles intact.  HEENT: Head atraumatic, normocephalic. Oropharynx and nasopharynx clear.  NECK:  Supple, no jugular venous distention. No thyroid enlargement, no tenderness.  LUNGS: Normal breath sounds bilaterally, no wheezing, rales,rhonchi or crepitation. No use of accessory muscles of respiration.  CARDIOVASCULAR: S1, S2 normal. No murmurs, rubs, or  gallops.  ABDOMEN: Soft. Bowel sounds present. No organomegaly or mass. Tender. Distended. EXTREMITIES: No pedal edema, cyanosis, or clubbing.  NEUROLOGIC: Cranial nerves II through XII are intact. Muscle strength 5/5 in all extremities. Sensation intact. Gait not checked.  PSYCHIATRIC: The patient is alert and oriented x 3.  SKIN: No obvious rash, lesion, or ulcer.  Left labia majora, erythema, tender.   LABORATORY PANEL:   CBC  Recent Labs Lab 10/08/14 1258  WBC 7.8  HGB 12.9  HCT 38.8  PLT 285   ------------------------------------------------------------------------------------------------------------------  Chemistries   Recent Labs Lab 10/04/14 1558 10/07/14 0348  NA 133* 134*  K 3.5 3.4*  CL 91* 95*  CO2 35* 32  GLUCOSE 91 91  BUN 8 7  CREATININE 0.57 0.45  CALCIUM 8.3* 8.0*  AST 32  --   ALT 19  --   ALKPHOS 87  --   BILITOT 0.9  --    ------------------------------------------------------------------------------------------------------------------  Cardiac Enzymes No results for input(s): TROPONINI in the last 168 hours. ------------------------------------------------------------------------------------------------------------------  RADIOLOGY:  Dg Abd 2 Views  10/07/2014   CLINICAL DATA:  Constipation, abdominal pain.  EXAM: ABDOMEN - 2 VIEW  COMPARISON:  CT abdomen pelvis 10/04/2014.  FINDINGS: Fair amount of stool and retained contrast in the colon. Mild colonic gaseous distention. No definite small bowel dilatation. Visualized lung bases show patchy right perihilar opacity and small right pleural effusion. Thoracolumbar vertebral body augmentations. Osteopenia.  IMPRESSION: 1. Mild gaseous distention of colon with retained stool and oral contrast. A mild colonic ileus can have this appearance. No definite small bowel obstruction. 2. Right perihilar opacity and small right pleural effusion are poorly evaluated due to incomplete visualization. Consider PA  and lateral views of the chest in further evaluation.   Electronically Signed   By: Lorin Picket M.D.   On: 10/07/2014 12:34    EKG:  No orders found for this or any previous visit.  ASSESSMENT AND PLAN:  74 year old Caucasian female history of COPD, oxygen dependent presenting with constipation of pelvic pain.  * Constipation: Continue bowel regimen. Scheduled colace and amitiza Cause of abdominal pain Appreciate GI input. F/u OP in 3-4 weeks for colonoscopy.  * Sepsis with cellulitis in her left labia and abcess. Neg blood culture and better leukocytosis.  Broad-spectrum antibiotics including vancomycin/Zosyn was discontinued and on clindamycin now. Discussed with Gyn Dr. Leonides Schanz for possible InD.  * urinary retention likely from the infection. Foley if further retention  * Hypertension essential: on metoprolol and lisinopril.  * Chronic back pain- Same  * Hyponatremia. Mild. Asymptomatic.  * COPD. Stable. Continue patient home nebulizer treatment.   Likely discharge later today or tomorrow  All the records are reviewed and case discussed with Care Management/Social Workerr. Management plans discussed with the patient, the patient's daughter and they are in agreement.  CODE STATUS: DO NOT RESUSCITATE   TOTAL TIME TAKING CARE OF THIS PATIENT: 40 minutes.   Likely d/c in AM    [name] R M.D on 10/09/2014 at 12:02 PM  Between 7am to 6pm - Pager - [phone]  After 6pm go to [email or web address] - password [email or web address] [facility]  [facility]  Office  [phone]  CC: Primary care physician; No primary care provider on file.

## 2014-10-10 DIAGNOSIS — G8929 Other chronic pain: Secondary | ICD-10-CM | POA: Diagnosis present

## 2014-10-10 DIAGNOSIS — T402X5A Adverse effect of other opioids, initial encounter: Secondary | ICD-10-CM

## 2014-10-10 DIAGNOSIS — R339 Retention of urine, unspecified: Secondary | ICD-10-CM | POA: Diagnosis not present

## 2014-10-10 DIAGNOSIS — K5903 Drug induced constipation: Secondary | ICD-10-CM | POA: Diagnosis present

## 2014-10-10 DIAGNOSIS — M545 Low back pain: Secondary | ICD-10-CM

## 2014-10-10 MED ORDER — SENNA 8.6 MG PO TABS: 1.0000 | ORAL_TABLET | Freq: Two times a day (BID) | ORAL | Status: DC

## 2014-10-10 MED ORDER — PANTOPRAZOLE SODIUM 40 MG PO TBEC: 40.0000 mg | DELAYED_RELEASE_TABLET | Freq: Two times a day (BID) | ORAL | Status: DC

## 2014-10-10 MED ORDER — LUBIPROSTONE 24 MCG PO CAPS: 24.0000 ug | ORAL_CAPSULE | Freq: Two times a day (BID) | ORAL | Status: DC

## 2014-10-10 MED ORDER — DOCUSATE SODIUM 100 MG PO CAPS: 200.0000 mg | ORAL_CAPSULE | Freq: Two times a day (BID) | ORAL | Status: DC

## 2014-10-10 MED ORDER — SULFAMETHOXAZOLE-TRIMETHOPRIM 400-80 MG PO TABS: 1.0000 | ORAL_TABLET | Freq: Two times a day (BID) | ORAL | Status: DC

## 2014-10-10 MED ORDER — DOCUSATE SODIUM 100 MG PO CAPS: 200.0000 mg | ORAL_CAPSULE | Freq: Two times a day (BID) | ORAL | Status: AC

## 2014-10-10 MED ORDER — METHYLNALTREXONE BROMIDE 12 MG/0.6ML ~~LOC~~ SOLN
12.0000 mg | Freq: Once | SUBCUTANEOUS | Status: AC
  Administered 2014-10-10: 12 mg via SUBCUTANEOUS

## 2014-10-10 NOTE — Care Management Note (Signed)
Case Management Note  Patient Details  Name: Amber Harrison MRN: [id number] Date of Birth: 08-May-1941  Subjective/Objective:                 Discharge planning                                                          Faxed discharge orders/resumption of care to Hospice and [place]. Spoke to [employer] at [facility] on the phone regarding this resumption of care.    Action/Plan:   Expected Discharge Date:                  Expected Discharge Plan:  Home w Hospice Care  In-House Referral:     Discharge planning Services  CM Consult  Post Acute Care Choice:    Choice offered to:  Patient, Sibling  DME Arranged:  N/A DME Agency:     HH Arranged:  RN [place] Agency:  [facility]  Status of Service:     Medicare Important Message Given:  Yes Date Medicare IM Given:  10/06/14 Medicare IM give by:  Marshell Garfinkel Date Additional Medicare IM Given:    Additional Medicare Important Message give by:     If discussed at [place] of Stay Meetings, dates discussed:    Additional Comments:  Cierah Crader A, RN 10/10/2014, 4:07 PM

## 2014-10-10 NOTE — Plan of Care (Signed)
MD making rounds. Discharge orders received by MD. IV dc'd. Discharge paperwork provided, explained, signed and witnessed. No unanswered questions. Discharged via wheelchair by nursing staff. Belongings sent with patient and family.

## 2014-10-10 NOTE — Progress Notes (Signed)
Resume Hospice services at [facility] home. V O: Dr Alveta Heimlich Sudini / Braxton Feathers, RN, BSN, CM on 10/10/14 at 1pm.

## 2014-10-10 NOTE — Discharge Instructions (Signed)
°  DIET:  °Cardiac diet ° °DISCHARGE CONDITION:  °Stable ° °ACTIVITY:  °Activity as tolerated ° °OXYGEN:  °Home Oxygen: Yes.   °  °Oxygen Delivery: 2 liters/min via Patient connected to nasal cannula oxygen ° °DISCHARGE LOCATION:  °home  ° °If you experience worsening of your admission symptoms, develop shortness of breath, life threatening emergency, suicidal or homicidal thoughts you must seek medical attention immediately by calling 911 or calling your MD immediately  if symptoms less severe. ° °You Must read complete instructions/literature along with all the possible adverse reactions/side effects for all the Medicines you take and that have been prescribed to you. Take any new Medicines after you have completely understood and accpet all the possible adverse reactions/side effects.  ° °Please note ° °You were cared for by a hospitalist during your hospital stay. If you have any questions about your discharge medications or the care you received while you were in the hospital after you are discharged, you can call the unit and asked to speak with the hospitalist on call if the hospitalist that took care of you is not available. Once you are discharged, your primary care physician will handle any further medical issues. Please note that NO REFILLS for any discharge medications will be authorized once you are discharged, as it is imperative that you return to your primary care physician (or establish a relationship with a primary care physician if you do not have one) for your aftercare needs so that they can reassess your need for medications and monitor your lab values. ° ° ° °

## 2014-10-10 NOTE — Plan of Care (Signed)
Problem: Discharge Progression Outcomes Goal: Discharge plan in place and appropriate Individualization  Pt prefers to be called Amber Harrison. She lives at home with her husband, followed by Hospice Admitted for Constipation and cellulitis to left labia Hx breast and lung Cancer, HTN, COPD, chronic back pain-controlled with home medications High fall risk. Bed alarm on with hourly rounding. Pt shows understanding of how to utlilize the call system.          Goal: Other Discharge Outcomes/Goals Outcome: Progressing Plan of care progress to goals: Afebrile. BP/HR WDL. Continue 2L O2 as at home. Lower back pain and lower abd pain managed with MS contin and oxycodone. Ate some of her dinner soup and ice cream. No c/o nausea. No BM during the night-passes flatus.  Foley intact and draining yellow urine- Per Dr Leonides Schanz note foley inserted to help relieve back pain and to be removed before discharge.

## 2014-10-13 NOTE — Discharge Summary (Signed)
[facility] NAME: Amber Harrison    MR#:  [id number]  DATE OF BIRTH:  10-24-1940  DATE OF ADMISSION:  10/04/2014 ADMITTING PHYSICIAN: Lytle Butte, MD  DATE OF DISCHARGE: 10/10/2014  2:16 PM  PRIMARY CARE PHYSICIAN: No primary care provider on file.    ADMISSION DIAGNOSIS:  Cellulitis of perineum [K93.818]  DISCHARGE DIAGNOSIS:  Principal Problem:   Sepsis Active Problems:   Essential hypertension   Hyperlipidemia   Coronary artery disease   COPD (chronic obstructive pulmonary disease)   Urinary retention   Constipation due to opioid therapy   Chronic low back pain   SECONDARY DIAGNOSIS:   Past Medical History  Diagnosis Date  . COPD (chronic obstructive pulmonary disease)   . Fibromyalgia   . Migraines   . Hypertension   . High cholesterol   . Heart attack 2013  . Cancer 2013    lung, breast  . Chronic back pain   . Fracture of lumbar spine      ADMITTING HISTORY Amber Harrison is a 74 y.o. female with a known history of COPD on chronic oxygen, essential hypertension, hyperlipidemia, fibromyalgia presenting with regimen with constipation. She describes constipation for a total of 10 days, with associated nausea, decreased by mouth intake, abdominal distention however denies any actual vomiting. She also describes one day duration of pain localized left labia, described as "pain", 6 out of 10, radiating upwards to groin, no worsening/relieving factors, with associated redness. She denies any vaginal discharge, dysuria or change in urinary frequency. She also attests to having subjective fevers chills present to the hospital for the above symptoms   HOSPITAL COURSE:   74 year old Caucasian female history of COPD, oxygen dependent presenting with constipation of pelvic pain.  * Constipation: Continue bowel regimen. Scheduled colace and amitiza. Cause of abdominal pain. Had some blood in stool once likely from  her severe constipation. Dr. Allen Norris has seen patient. Appreciate GI input. F/u OP in 3-4 weeks for colonoscopy. Hemoglobin stable.  * Sepsis with cellulitis in her left labia and abcess. Neg blood culture and better leukocytosis. Broad-spectrum antibiotics including vancomycin/Zosyn was discontinued and on clindamycin. Bactrim at discharge. Discussed with Gyn Dr. Leonides Schanz for possible InD. Plan is to conitnue antibiotics and see in office next week if she needs InD.   * urinary retention likely from the infection. Foley was placed. Later discontinued and patient able to pass urine normally.  * Hypertension essential: on metoprolol and lisinopril.  * Chronic back pain- Same  * Hyponatremia. Mild. Asymptomatic.  * COPD. Stable. Continue patient home nebulizer treatment.  Stable at discharge CONSULTS OBTAINED:  Treatment Team:  Lytle Butte, MD Maceo Pro, MD  DRUG ALLERGIES:   Allergies  Allergen Reactions  . Macrobid [Nitrofurantoin Monohyd Macro]     DISCHARGE MEDICATIONS:   Discharge Medication List as of 10/10/2014  1:39 PM    CONTINUE these medications which have CHANGED   Details  docusate sodium (COLACE) 100 MG capsule Take 2 capsules (200 mg total) by mouth 2 (two) times daily., Starting 10/10/2014, Until Discontinued, Normal    lubiprostone (AMITIZA) 24 MCG capsule Take 1 capsule (24 mcg total) by mouth 2 (two) times daily with a meal., Starting 10/10/2014, Until Discontinued, Normal    pantoprazole (PROTONIX) 40 MG tablet Take 1 tablet (40 mg total) by mouth 2 (two) times daily., Starting 10/10/2014, Until Discontinued, Normal    !! senna (SENOKOT) 8.6 MG TABS tablet  Take 1 tablet (8.6 mg total) by mouth 2 (two) times daily., Starting 10/10/2014, Until Discontinued, Normal    sulfamethoxazole-trimethoprim (BACTRIM) 400-80 MG per tablet Take 1 tablet by mouth 2 (two) times daily., Starting 10/10/2014, Until Discontinued, Normal     !! - Potential duplicate medications  found. Please discuss with provider.    CONTINUE these medications which have NOT CHANGED   Details  albuterol (PROVENTIL HFA;VENTOLIN HFA) 108 (90 BASE) MCG/ACT inhaler Inhale 2 puffs into the lungs every 4 (four) hours as needed for wheezing or shortness of breath., Until Discontinued, Historical Med    aspirin 81 MG tablet Take 81 mg by mouth daily., Until Discontinued, Historical Med    cholecalciferol (VITAMIN D) 1000 UNITS tablet Take 1,000 Units by mouth daily., Until Discontinued, Historical Med    Fluticasone Furoate-Vilanterol 100-25 MCG/INH AEPB Inhale 1 puff into the lungs daily., Until Discontinued, Historical Med    furosemide (LASIX) 20 MG tablet Take 20 mg by mouth daily. prn, Until Discontinued, Historical Med    guaiFENesin (MUCINEX) 600 MG 12 hr tablet Take 600 mg by mouth 2 (two) times daily., Until Discontinued, Historical Med    lisinopril (PRINIVIL,ZESTRIL) 20 MG tablet Take 20 mg by mouth 2 (two) times daily., Until Discontinued, Historical Med    magnesium hydroxide (MILK OF MAGNESIA) 400 MG/5ML suspension Take 30 mLs by mouth daily as needed for moderate constipation or indigestion., Until Discontinued, Historical Med    metoprolol succinate (TOPROL-XL) 100 MG 24 hr tablet Take 100 mg by mouth daily. Take with or immediately following a meal., Until Discontinued, Historical Med    morphine (MS CONTIN) 30 MG 12 hr tablet Take 30 mg by mouth every 12 (twelve) hours., Until Discontinued, Historical Med    nitroGLYCERIN (NITROSTAT) 0.4 MG SL tablet Place 0.4 mg under the tongue every 5 (five) minutes as needed for chest pain., Until Discontinued, Historical Med    ondansetron (ZOFRAN) 4 MG tablet Take 4 mg by mouth every 8 (eight) hours as needed for nausea or vomiting., Until Discontinued, Historical Med    oxyCODONE (OXY IR/ROXICODONE) 5 MG immediate release tablet Take 10 mg by mouth every 4 (four) hours as needed for severe pain., Until Discontinued, Historical  Med    predniSONE (DELTASONE) 5 MG tablet Take 5 mg by mouth daily with breakfast., Until Discontinued, Historical Med    !! senna (SENOKOT) 8.6 MG TABS tablet Take 1 tablet by mouth at bedtime as needed for mild constipation., Until Discontinued, Historical Med    sorbitol 70 % solution Take 30 mLs by mouth 2 (two) times daily. prn, Until Discontinued, Historical Med    tiotropium (SPIRIVA) 18 MCG inhalation capsule Place 18 mcg into inhaler and inhale daily., Until Discontinued, Historical Med     !! - Potential duplicate medications found. Please discuss with provider.         DISCHARGE CONDITION:  Stable     Today    VITAL SIGNS:  Blood pressure 135/79, pulse 91, temperature 99 F (37.2 C), temperature source Oral, resp. rate 18, height 5' (1.524 m), weight 47.174 kg (104 lb), SpO2 95 %.  I/O:  No intake or output data in the 24 hours ending 10/13/14 1233  PHYSICAL EXAMINATION:  Physical Exam  GENERAL:  74 y.o.-year-old patient lying in the bed with no acute distress.  LUNGS: Normal breath sounds bilaterally, no wheezing, rales,rhonchi or crepitation. No use of accessory muscles of respiration.  CARDIOVASCULAR: S1, S2 normal. No murmurs, rubs, or gallops.  ABDOMEN: Soft, non-tender, non-distended. Bowel sounds present. No organomegaly or mass.  NEUROLOGIC: Moves all 4 extremities. PSYCHIATRIC: The patient is alert and oriented x 3.  SKIN: No obvious rash, lesion, or ulcer.   DATA REVIEW:   CBC  Recent Labs Lab 10/08/14 1258  WBC 7.8  HGB 12.9  HCT 38.8  PLT 285    Chemistries   Recent Labs Lab 10/07/14 0348  NA 134*  K 3.4*  CL 95*  CO2 32  GLUCOSE 91  BUN 7  CREATININE 0.45  CALCIUM 8.0*    Cardiac Enzymes No results for input(s): TROPONINI in the last 168 hours.  Microbiology Results  Results for orders placed or performed during the hospital encounter of 10/04/14  Blood culture (routine x 2)     Status: None   Collection Time:  10/04/14  9:27 PM  Result Value Ref Range Status   Specimen Description BLOOD  Final   Special Requests NONE  Final   Culture NO GROWTH 5 DAYS  Final   Report Status 10/09/2014 FINAL  Final  Blood culture (routine x 2)     Status: None   Collection Time: 10/04/14  9:27 PM  Result Value Ref Range Status   Specimen Description BLOOD  Final   Special Requests NONE  Final   Culture NO GROWTH 5 DAYS  Final   Report Status 10/09/2014 FINAL  Final    RADIOLOGY:  No results found.    Management plans discussed with the patient, family and they are in agreement.  CODE STATUS:  Advance Directive Documentation        Most Recent Value   Type of Advance Directive  Out of facility DNR (pink MOST or yellow form)   Pre-existing out of facility DNR order (yellow form or pink MOST form)  Physician notified to receive inpatient order   "MOST" Form in Place?        TOTAL TIME TAKING CARE OF THIS PATIENT ON DAY OF DISCHARGE: 45 minutes.    [name] R M.D on 10/13/2014 at 12:33 PM  Between 7am to 6pm - Pager - [phone]  After 6pm go to [email or web address] - password [email or web address] [facility]  [facility]  Office  [phone]  CC: Primary care physician; No primary care provider on file.

## 2014-12-10 ENCOUNTER — Telehealth: Payer: Self-pay | Admitting: *Deleted

## 2014-12-10 MED ORDER — PREDNISONE 5 MG PO TABS: 5.0000 mg | ORAL_TABLET | Freq: Every day | ORAL | Status: DC

## 2014-12-10 NOTE — Telephone Encounter (Signed)
Faxed in

## 2015-01-19 ENCOUNTER — Other Ambulatory Visit: Payer: Self-pay | Admitting: *Deleted

## 2015-01-19 DIAGNOSIS — D751 Secondary polycythemia: Secondary | ICD-10-CM

## 2015-01-21 ENCOUNTER — Inpatient Hospital Stay: Payer: Medicare Other | Attending: Internal Medicine

## 2015-01-21 VITALS — BP 134/73 | HR 82 | Resp 22

## 2015-01-21 DIAGNOSIS — J449 Chronic obstructive pulmonary disease, unspecified: Secondary | ICD-10-CM | POA: Diagnosis not present

## 2015-01-21 DIAGNOSIS — F17210 Nicotine dependence, cigarettes, uncomplicated: Secondary | ICD-10-CM | POA: Insufficient documentation

## 2015-01-21 DIAGNOSIS — D751 Secondary polycythemia: Secondary | ICD-10-CM | POA: Diagnosis present

## 2015-04-01 ENCOUNTER — Other Ambulatory Visit: Payer: Self-pay | Admitting: Hematology and Oncology

## 2015-04-01 NOTE — Telephone Encounter (Signed)
  Do I know this patient?  Why is she on prednisone?  M

## 2015-04-19 DIAGNOSIS — I10 Essential (primary) hypertension: Secondary | ICD-10-CM | POA: Insufficient documentation

## 2015-04-19 DIAGNOSIS — I872 Venous insufficiency (chronic) (peripheral): Secondary | ICD-10-CM | POA: Insufficient documentation

## 2015-04-19 DIAGNOSIS — I119 Hypertensive heart disease without heart failure: Secondary | ICD-10-CM | POA: Insufficient documentation

## 2015-04-19 DIAGNOSIS — R600 Localized edema: Secondary | ICD-10-CM | POA: Insufficient documentation

## 2015-04-19 DIAGNOSIS — E782 Mixed hyperlipidemia: Secondary | ICD-10-CM | POA: Insufficient documentation

## 2015-05-18 ENCOUNTER — Telehealth: Payer: Self-pay | Admitting: *Deleted

## 2015-05-18 DIAGNOSIS — C3491 Malignant neoplasm of unspecified part of right bronchus or lung: Secondary | ICD-10-CM

## 2015-05-18 DIAGNOSIS — J441 Chronic obstructive pulmonary disease with (acute) exacerbation: Secondary | ICD-10-CM

## 2015-05-18 NOTE — Addendum Note (Signed)
Addended by: Luella Cook on: 05/18/2015 04:00 PM   Modules accepted: Orders

## 2015-05-18 NOTE — Telephone Encounter (Signed)
Patient is uner hospice care and will await return call form hospice nurse

## 2015-05-18 NOTE — Telephone Encounter (Signed)
Pt states that she had been feeling tight in the chest and called her PCP and they have her prednisone.  On Sat. She coughed up some sputum and it had some blood in it.  Se stopped her aspirin and she did not have any more blood in sputum until Monday afternoon. She says it is sputum with streaks of blood, not blood clots.I spoke to Dr. Rudean Hitt and he will be happy to see her.  He would like a pa and lat. cxr before hand.  Blood work also.  I called pt back and explained above and [facility] And have cxr and then come to cancer center 11:15 for blood and 11:30 to see Dr. Rudean Hitt.

## 2015-05-19 ENCOUNTER — Inpatient Hospital Stay (HOSPITAL_BASED_OUTPATIENT_CLINIC_OR_DEPARTMENT_OTHER): Payer: Medicare Other | Admitting: Internal Medicine

## 2015-05-19 ENCOUNTER — Ambulatory Visit
Admission: RE | Admit: 2015-05-19 | Discharge: 2015-05-19 | Disposition: A | Discharge status: Home / Self Care | Payer: Medicare Other | Source: Ambulatory Visit | Attending: Internal Medicine | Admitting: Internal Medicine

## 2015-05-19 ENCOUNTER — Inpatient Hospital Stay: Payer: Medicare Other | Attending: Internal Medicine

## 2015-05-19 ENCOUNTER — Encounter: Payer: Self-pay | Admitting: Internal Medicine

## 2015-05-19 VITALS — BP 144/88 | HR 74 | Temp 98.0°F | Resp 20 | Ht 60.0 in | Wt 109.1 lb

## 2015-05-19 DIAGNOSIS — I10 Essential (primary) hypertension: Secondary | ICD-10-CM | POA: Diagnosis not present

## 2015-05-19 DIAGNOSIS — Z7982 Long term (current) use of aspirin: Secondary | ICD-10-CM

## 2015-05-19 DIAGNOSIS — M797 Fibromyalgia: Secondary | ICD-10-CM | POA: Diagnosis not present

## 2015-05-19 DIAGNOSIS — F17210 Nicotine dependence, cigarettes, uncomplicated: Secondary | ICD-10-CM | POA: Insufficient documentation

## 2015-05-19 DIAGNOSIS — E7800 Pure hypercholesterolemia, unspecified: Secondary | ICD-10-CM | POA: Diagnosis not present

## 2015-05-19 DIAGNOSIS — J449 Chronic obstructive pulmonary disease, unspecified: Secondary | ICD-10-CM

## 2015-05-19 DIAGNOSIS — J441 Chronic obstructive pulmonary disease with (acute) exacerbation: Secondary | ICD-10-CM

## 2015-05-19 DIAGNOSIS — J90 Pleural effusion, not elsewhere classified: Secondary | ICD-10-CM | POA: Insufficient documentation

## 2015-05-19 DIAGNOSIS — Z9981 Dependence on supplemental oxygen: Secondary | ICD-10-CM | POA: Diagnosis not present

## 2015-05-19 DIAGNOSIS — Z79899 Other long term (current) drug therapy: Secondary | ICD-10-CM | POA: Diagnosis not present

## 2015-05-19 DIAGNOSIS — Z7952 Long term (current) use of systemic steroids: Secondary | ICD-10-CM | POA: Diagnosis not present

## 2015-05-19 DIAGNOSIS — Z85118 Personal history of other malignant neoplasm of bronchus and lung: Secondary | ICD-10-CM | POA: Diagnosis not present

## 2015-05-19 DIAGNOSIS — C3491 Malignant neoplasm of unspecified part of right bronchus or lung: Secondary | ICD-10-CM | POA: Insufficient documentation

## 2015-05-19 DIAGNOSIS — I252 Old myocardial infarction: Secondary | ICD-10-CM | POA: Diagnosis not present

## 2015-05-19 DIAGNOSIS — J9811 Atelectasis: Secondary | ICD-10-CM

## 2015-05-19 DIAGNOSIS — R042 Hemoptysis: Secondary | ICD-10-CM | POA: Insufficient documentation

## 2015-05-19 DIAGNOSIS — D751 Secondary polycythemia: Secondary | ICD-10-CM

## 2015-05-19 LAB — COMPREHENSIVE METABOLIC PANEL
ALBUMIN: 3.7 g/dL (ref 3.5–5.0)
ALK PHOS: 50 U/L (ref 38–126)
ALT: 10 U/L — AB (ref 14–54)
ANION GAP: 6 (ref 5–15)
AST: 15 U/L (ref 15–41)
BUN: 24 mg/dL — ABNORMAL HIGH (ref 6–20)
CALCIUM: 9 mg/dL (ref 8.9–10.3)
CO2: 32 mmol/L (ref 22–32)
Chloride: 99 mmol/L — ABNORMAL LOW (ref 101–111)
Creatinine, Ser: 0.76 mg/dL (ref 0.44–1.00)
GFR calc Af Amer: >60 mL/min (ref >60)
GFR calc non Af Amer: >60 mL/min (ref >60)
GLUCOSE: 95 mg/dL (ref 65–99)
Potassium: 3.7 mmol/L (ref 3.5–5.1)
SODIUM: 137 mmol/L (ref 135–145)
Total Bilirubin: 0.5 mg/dL (ref 0.3–1.2)
Total Protein: 6.8 g/dL (ref 6.5–8.1)

## 2015-05-19 LAB — CBC WITH DIFFERENTIAL/PLATELET
BASOS PCT: 1 %
Basophils Absolute: 0.1 10*3/uL (ref 0–0.1)
EOS ABS: 0.1 10*3/uL (ref 0–0.7)
Eosinophils Relative: 1 %
HCT: 49.5 % — ABNORMAL HIGH (ref 35.0–47.0)
HEMOGLOBIN: 16.6 g/dL — AB (ref 12.0–16.0)
Lymphocytes Relative: 11 %
Lymphs Abs: 1.1 10*3/uL (ref 1.0–3.6)
MCH: 33.4 pg (ref 26.0–34.0)
MCHC: 33.5 g/dL (ref 32.0–36.0)
MCV: 99.6 fL (ref 80.0–100.0)
Monocytes Absolute: 1.2 10*3/uL — ABNORMAL HIGH (ref 0.2–0.9)
Monocytes Relative: 11 %
NEUTROS PCT: 76 %
Neutro Abs: 8.3 10*3/uL — ABNORMAL HIGH (ref 1.4–6.5)
Platelets: 238 10*3/uL (ref 150–440)
RBC: 4.97 MIL/uL (ref 3.80–5.20)
RDW: 14.4 % (ref 11.5–14.5)
WBC: 10.8 10*3/uL (ref 3.6–11.0)

## 2015-05-19 NOTE — Progress Notes (Signed)
Pt had some streak of blood in sputum on sat. And she stopped the aspirin. And then she had some more sputum on Monday with streaks of blood in it. She is taking prednisone and appetite is better on the med.  She has swelling of ext. And her cardiologist placed her on torsemide.  Her chronic back pain has not changed.

## 2015-05-19 NOTE — Progress Notes (Signed)
[place] @ [facility] [phone]  Fax:([phone]) [place]: 1941-05-06  MR#: [id number]  EHU#:[id number]  Patient Care Team: Ellamae Sia, MD as PCP - General (Internal Medicine)  CHIEF COMPLAINT:  Chief Complaint  Patient presents with  . Lung Cancer   squamous cell carcinoma of the right lung   No history exists.    Oncology Flowsheet 10/05/2014 10/06/2014 10/07/2014 10/07/2014 10/08/2014 10/09/2014 10/10/2014  enoxaparin (LOVENOX) [place] - - - - 40 mg 40 mg -  ondansetron (ZOFRAN) IV - - 4 mg 4 mg - 4 mg 4 mg  ondansetron (ZOFRAN) PO - - - - - 4 mg -  predniSONE (DELTASONE) PO 5 mg 5 mg - - 5 mg 5 mg 5 mg    HISTORY OF PRESENT ILLNESS:   Amber Harrison is a 75 year old lady with a history of right lung squamous cell carcinoma, status post with, who has been on observation for the past 3 years with no evidence of recurrence. She contacted our clinic last week with complaints of scant hemoptysis, which lasted for 2 days, and then after she stopped aspirin resolved for the next 2 days, but started again 2 days ago. She claims that she is more short of breath than before, but denies any chest pain, fevers, night sweats. She still requires continuous oxygen at home, and continues to smoke at least one pack per day.  REVIEW OF SYSTEMS:   ROS   PAST MEDICAL HISTORY: Past Medical History  Diagnosis Date  . COPD (chronic obstructive pulmonary disease) ([place])   . Fibromyalgia   . Migraines   . Hypertension   . High cholesterol   . Heart attack ([place]) 2013  . Cancer ([place]) 2013    lung, breast  . Chronic back pain   . Fracture of lumbar spine ([place])   . Emphysema of lung ([place])     PAST SURGICAL HISTORY: Past Surgical History  Procedure Laterality Date  . Tonsillectomy    . Eye surgery Bilateral   . Mastectomy Bilateral   . Abdominal hysterectomy      total  . Inguinal hernia repair Right   . Kyphoplasty  2012    FAMILY HISTORY Family History  Problem  Relation Age of Onset  . CAD Other     ADVANCED DIRECTIVES:  No flowsheet data found.  HEALTH MAINTENANCE: Social History  Substance Use Topics  . Smoking status: Current Every Day Smoker -- 0.50 packs/day for 50 years    Types: Cigarettes  . Smokeless tobacco: Never Used  . Alcohol Use: No     Allergies  Allergen Reactions  . Macrobid [employer] Macro]     Current Outpatient Prescriptions  Medication Sig Dispense Refill  . albuterol (PROVENTIL HFA;VENTOLIN HFA) 108 (90 BASE) MCG/ACT inhaler Inhale 2 puffs into the lungs every 4 (four) hours as needed for wheezing or shortness of breath.    . cholecalciferol (VITAMIN D) 1000 UNITS tablet Take 1,000 Units by mouth daily.    Marland Kitchen docusate sodium (COLACE) 100 MG capsule Take 2 capsules (200 mg total) by mouth 2 (two) times daily. 60 capsule 0  . Fluticasone Furoate-Vilanterol 100-25 MCG/INH AEPB Inhale 1 puff into the lungs daily.    Marland Kitchen guaiFENesin (MUCINEX) 600 MG 12 hr tablet Take 600 mg by mouth 2 (two) times daily.    Marland Kitchen lisinopril (PRINIVIL,ZESTRIL) 20 MG tablet Take 20 mg by mouth 2 (two) times daily.    . magnesium hydroxide (MILK  OF MAGNESIA) 400 MG/5ML suspension Take 30 mLs by mouth daily as needed for moderate constipation or indigestion.    . metoprolol succinate (TOPROL-XL) 100 MG 24 hr tablet Take 100 mg by mouth daily. Take with or immediately following a meal.    . morphine (MS CONTIN) 30 MG 12 hr tablet Take 30 mg by mouth every 12 (twelve) hours.    . nitroGLYCERIN (NITROSTAT) 0.4 MG SL tablet Place 0.4 mg under the tongue every 5 (five) minutes as needed for chest pain.    Marland Kitchen ondansetron (ZOFRAN) 4 MG tablet Take 4 mg by mouth every 8 (eight) hours as needed for nausea or vomiting.    Marland Kitchen oxyCODONE (OXY IR/ROXICODONE) 5 MG immediate release tablet Take 10 mg by mouth every 4 (four) hours as needed for severe pain.    . pantoprazole (PROTONIX) 40 MG tablet Take 1 tablet (40 mg total) by mouth 2 (two) times  daily. 60 tablet 0  . predniSONE (DELTASONE) 5 MG tablet TAKE ONE (1) TABLET EACH DAY 30 tablet 0  . senna (SENOKOT) 8.6 MG TABS tablet Take 1 tablet by mouth at bedtime as needed for mild constipation.    . senna (SENOKOT) 8.6 MG TABS tablet Take 1 tablet (8.6 mg total) by mouth 2 (two) times daily. 60 each 0  . sorbitol 70 % solution Take 30 mLs by mouth 2 (two) times daily. prn    . tiotropium (SPIRIVA) 18 MCG inhalation capsule Place 18 mcg into inhaler and inhale daily.    Marland Kitchen torsemide (DEMADEX) 20 MG tablet Take 20 mg by mouth daily.    Marland Kitchen aspirin 81 MG tablet Take 81 mg by mouth daily. Reported on 05/19/2015     No current facility-administered medications for this visit.    OBJECTIVE:  Filed Vitals:   05/19/15 1202  BP: 144/88  Pulse: 74  Temp: 98 F (36.7 C)  Resp: 20     Body mass index is 21.31 kg/(m^2).    ECOG FS:3 - Symptomatic, >50% confined to bed  Physical Exam  Constitutional: She is oriented to person, place, and time. She appears distressed.  Thin elderly Caucasian female in mild respiratory distress at rest  HENT:  Head: Normocephalic and atraumatic.  Right Ear: External ear normal.  Left Ear: External ear normal.  Mouth/Throat: Oropharynx is clear and moist.  Eyes: Conjunctivae are normal. Pupils are equal, round, and reactive to light. Right eye exhibits no discharge. Left eye exhibits no discharge. No scleral icterus.  Neck: Normal range of motion. Neck supple. No JVD present. No tracheal deviation present. No thyromegaly present.  Cardiovascular: Normal rate, regular rhythm, normal heart sounds and intact distal pulses.  Exam reveals no gallop and no friction rub.   No murmur heard. Pulmonary/Chest: No stridor. She is in respiratory distress. She has decreased breath sounds in the right lower field. She has no wheezes. She has no rales. Chest wall is dull to percussion (At the right base). She exhibits no tenderness.  Abdominal: Soft. Bowel sounds are normal.  She exhibits no distension and no mass. There is no tenderness. There is no rebound and no guarding.  Genitourinary:  Postponed  Musculoskeletal: Normal range of motion. She exhibits no edema or tenderness.  Lymphadenopathy:    She has no cervical adenopathy.  Neurological: She is alert and oriented to person, place, and time. She has normal reflexes. No cranial nerve deficit. She exhibits normal muscle tone. Gait normal. Coordination normal. GCS score is 15.  Skin:  Skin is warm. No rash noted. She is not diaphoretic. No erythema. No pallor.  Psychiatric: Mood, memory, affect and judgment normal.  Nursing note and vitals reviewed.    LAB RESULTS:  CBC Latest Ref Rng 05/19/2015 10/08/2014  WBC 3.6 - 11.0 K/uL 10.8 7.8  Hemoglobin 12.0 - 16.0 g/dL 16.6(H) 12.9  Hematocrit 35.0 - 47.0 % 49.5(H) 38.8  Platelets 150 - 440 K/uL 238 285    Appointment on 05/19/2015  Component Date Value Ref Range Status  . WBC 05/19/2015 10.8  3.6 - 11.0 K/uL Final  . RBC 05/19/2015 4.97  3.80 - 5.20 MIL/uL Final  . Hemoglobin 05/19/2015 16.6* 12.0 - 16.0 g/dL Final  . HCT 05/19/2015 49.5* 35.0 - 47.0 % Final  . MCV 05/19/2015 99.6  80.0 - 100.0 fL Final  . MCH 05/19/2015 33.4  26.0 - 34.0 pg Final  . MCHC 05/19/2015 33.5  32.0 - 36.0 g/dL Final  . RDW 05/19/2015 14.4  11.5 - 14.5 % Final  . Platelets 05/19/2015 238  150 - 440 K/uL Final  . Neutrophils Relative % 05/19/2015 76   Final  . Neutro Abs 05/19/2015 8.3* 1.4 - 6.5 K/uL Final  . Lymphocytes Relative 05/19/2015 11   Final  . Lymphs Abs 05/19/2015 1.1  1.0 - 3.6 K/uL Final  . Monocytes Relative 05/19/2015 11   Final  . Monocytes Absolute 05/19/2015 1.2* 0.2 - 0.9 K/uL Final  . Eosinophils Relative 05/19/2015 1   Final  . Eosinophils Absolute 05/19/2015 0.1  0 - 0.7 K/uL Final  . Basophils Relative 05/19/2015 1   Final  . Basophils Absolute 05/19/2015 0.1  0 - 0.1 K/uL Final  . Sodium 05/19/2015 137  135 - 145 mmol/L Final  . Potassium  05/19/2015 3.7  3.5 - 5.1 mmol/L Final  . Chloride 05/19/2015 99* 101 - 111 mmol/L Final  . CO2 05/19/2015 32  22 - 32 mmol/L Final  . Glucose, Bld 05/19/2015 95  65 - 99 mg/dL Final  . BUN 05/19/2015 24* 6 - 20 mg/dL Final  . Creatinine, Ser 05/19/2015 0.76  0.44 - 1.00 mg/dL Final  . Calcium 05/19/2015 9.0  8.9 - 10.3 mg/dL Final  . Total Protein 05/19/2015 6.8  6.5 - 8.1 g/dL Final  . Albumin 05/19/2015 3.7  3.5 - 5.0 g/dL Final  . AST 05/19/2015 15  15 - 41 U/L Final  . ALT 05/19/2015 10* 14 - 54 U/L Final  . Alkaline Phosphatase 05/19/2015 50  38 - 126 U/L Final  . Total Bilirubin 05/19/2015 0.5  0.3 - 1.2 mg/dL Final  . GFR calc non Af Amer 05/19/2015 >60  >60 mL/min Final  . GFR calc Af Amer 05/19/2015 >60  >60 mL/min Final   Comment: (NOTE) The eGFR has been calculated using the CKD EPI equation. This calculation has not been validated in all clinical situations. eGFR's persistently <60 mL/min signify possible Chronic Kidney Disease.   . Anion gap 05/19/2015 6  5 - 15 Final      STUDIES: Dg Chest 2 View  05/19/2015  CLINICAL DATA:  Cough, emphysema, COPD.  Lung cancer. EXAM: CHEST  2 VIEW COMPARISON:  Chest CT 11/11/2013.  Chest x-ray 12/09/2011. FINDINGS: There is hyperinflation of the lungs compatible with COPD. Marked tortuosity of the thoracic aorta. Heart is normal size. Small right pleural effusion with right lower lobe atelectasis or infiltrate. No acute bony abnormality. Multilevel vertebroplasty changes in the lower thoracic and lumbar spine. Multiple additional compression fractures in the  mid and lower thoracic spine. IMPRESSION: COPD. Small right pleural effusion with right lower lobe atelectasis or infiltrate. Electronically Signed   By: Rolm Baptise M.D.   On: 05/19/2015 12:08    ASSESSMENT and MEDICAL DECISION MAKING:  Hemoptysis, right-sided pleural effusion-patient with a history of COPD and right sided lung cancer, who presents with these complaints, we should  be concerned about infectious process, mucous plug or recurrence of the tumor (I personally reviewed chest x-ray images.) The patient appears to be febrile and relatively asymptomatic as compared to her baseline, so I don't feel compelled to administer antibiotics at this point. Rather, I would prefer to perform CT of the chest with contrast to better evaluate the process, developing in the right lung, to make a decision on the treatment approach depending on the results of the imaging study. Hematologic parameters appear to be stable, patient has known severe terminal COPD, associated with a secondary erythrocytosis, which does not require any interventions, such as phlebotomy. We will contact patient once the results of the CT chest are available for review.  Patient expressed understanding and was in agreement with this plan. She also understands that She can call clinic at any time with any questions, concerns, or complaints.    No matching staging information was found for the patient.  Roxana Hires, MD   05/19/2015 1:22 PM

## 2015-05-21 ENCOUNTER — Ambulatory Visit
Admission: RE | Admit: 2015-05-21 | Discharge: 2015-05-21 | Disposition: A | Discharge status: Home / Self Care | Source: Ambulatory Visit | Attending: Internal Medicine | Admitting: Internal Medicine

## 2015-05-21 DIAGNOSIS — R042 Hemoptysis: Secondary | ICD-10-CM | POA: Diagnosis present

## 2015-05-21 DIAGNOSIS — I712 Thoracic aortic aneurysm, without rupture: Secondary | ICD-10-CM | POA: Diagnosis not present

## 2015-05-21 DIAGNOSIS — M439 Deforming dorsopathy, unspecified: Secondary | ICD-10-CM | POA: Diagnosis not present

## 2015-05-21 DIAGNOSIS — C3491 Malignant neoplasm of unspecified part of right bronchus or lung: Secondary | ICD-10-CM | POA: Diagnosis not present

## 2015-05-21 HISTORY — DX: Heart failure, unspecified: I50.9

## 2015-05-21 MED ORDER — IOHEXOL 300 MG/ML  SOLN
75.0000 mL | Freq: Once | INTRAMUSCULAR | Status: AC | PRN
  Administered 2015-05-21: 75 mL via INTRAVENOUS

## 2015-06-07 ENCOUNTER — Other Ambulatory Visit: Payer: Self-pay | Admitting: Hematology and Oncology

## 2015-06-15 ENCOUNTER — Ambulatory Visit (INDEPENDENT_AMBULATORY_CARE_PROVIDER_SITE_OTHER): Admitting: Internal Medicine

## 2015-06-15 ENCOUNTER — Inpatient Hospital Stay: Admission: RE | Admit: 2015-06-15 | Payer: Medicare Other | Source: Ambulatory Visit

## 2015-06-15 ENCOUNTER — Encounter: Payer: Self-pay | Admitting: Internal Medicine

## 2015-06-15 VITALS — BP 128/74 | HR 85 | Ht 65.0 in | Wt 99.0 lb

## 2015-06-15 DIAGNOSIS — R918 Other nonspecific abnormal finding of lung field: Secondary | ICD-10-CM

## 2015-06-15 MED ORDER — ONDANSETRON HCL 4 MG PO TABS: 4.0000 mg | ORAL_TABLET | Freq: Four times a day (QID) | ORAL | Status: AC | PRN

## 2015-06-15 NOTE — Patient Instructions (Signed)
Chronic Obstructive Pulmonary Disease Chronic obstructive pulmonary disease (COPD) is a common lung condition in which airflow from the lungs is limited. COPD is a general term that can be used to describe many different lung problems that limit airflow, including both chronic bronchitis and emphysema. If you have COPD, your lung function will probably never return to normal, but there are measures you can take to improve lung function and make yourself feel better. CAUSES   Smoking (common).  Exposure to secondhand smoke.  Genetic problems.  Chronic inflammatory lung diseases or recurrent infections. SYMPTOMS  Shortness of breath, especially with physical activity.  Deep, persistent (chronic) cough with a large amount of thick mucus.  Wheezing.  Rapid breaths (tachypnea).  Gray or bluish discoloration (cyanosis) of the skin, especially in your fingers, toes, or lips.  Fatigue.  Weight loss.  Frequent infections or episodes when breathing symptoms become much worse (exacerbations).  Chest tightness. DIAGNOSIS Your health care provider will take a medical history and perform a physical examination to diagnose COPD. Additional tests for COPD may include:  Lung (pulmonary) function tests.  Chest X-ray.  CT scan.  Blood tests. TREATMENT  Treatment for COPD may include:  Inhaler and nebulizer medicines. These help manage the symptoms of COPD and make your breathing more comfortable.  Supplemental oxygen. Supplemental oxygen is only helpful if you have a low oxygen level in your blood.  Exercise and physical activity. These are beneficial for nearly all people with COPD.  Lung surgery or transplant.  Nutrition therapy to gain weight, if you are underweight.  Pulmonary rehabilitation. This may involve working with a team of health care providers and specialists, such as respiratory, occupational, and physical therapists. HOME CARE INSTRUCTIONS  Take all medicines  (inhaled or pills) as directed by your health care provider.  Avoid over-the-counter medicines or cough syrups that dry up your airway (such as antihistamines) and slow down the elimination of secretions unless instructed otherwise by your health care provider.  If you are a smoker, the most important thing that you can do is stop smoking. Continuing to smoke will cause further lung damage and breathing trouble. Ask your health care provider for help with quitting smoking. He or she can direct you to community resources or hospitals that provide support.  Avoid exposure to irritants such as smoke, chemicals, and fumes that aggravate your breathing.  Use oxygen therapy and pulmonary rehabilitation if directed by your health care provider. If you require home oxygen therapy, ask your health care provider whether you should purchase a pulse oximeter to measure your oxygen level at home.  Avoid contact with individuals who have a contagious illness.  Avoid extreme temperature and humidity changes.  Eat healthy foods. Eating smaller, more frequent meals and resting before meals may help you maintain your strength.  Stay active, but balance activity with periods of rest. Exercise and physical activity will help you maintain your ability to do things you want to do.  Preventing infection and hospitalization is very important when you have COPD. Make sure to receive all the vaccines your health care provider recommends, especially the pneumococcal and influenza vaccines. Ask your health care provider whether you need a pneumonia vaccine.  Learn and use relaxation techniques to manage stress.  Learn and use controlled breathing techniques as directed by your health care provider. Controlled breathing techniques include:  Pursed lip breathing. Start by breathing in (inhaling) through your nose for 1 second. Then, purse your lips as if you were   going to whistle and breathe out (exhale) through the  pursed lips for 2 seconds.  Diaphragmatic breathing. Start by putting one hand on your abdomen just above your waist. Inhale slowly through your nose. The hand on your abdomen should move out. Then purse your lips and exhale slowly. You should be able to feel the hand on your abdomen moving in as you exhale.  Learn and use controlled coughing to clear mucus from your lungs. Controlled coughing is a series of short, progressive coughs. The steps of controlled coughing are: 1. Lean your head slightly forward. 2. Breathe in deeply using diaphragmatic breathing. 3. Try to hold your breath for 3 seconds. 4. Keep your mouth slightly open while coughing twice. 5. Spit any mucus out into a tissue. 6. Rest and repeat the steps once or twice as needed. SEEK MEDICAL CARE IF:  You are coughing up more mucus than usual.  There is a change in the color or thickness of your mucus.  Your breathing is more labored than usual.  Your breathing is faster than usual. SEEK IMMEDIATE MEDICAL CARE IF:  You have shortness of breath while you are resting.  You have shortness of breath that prevents you from:  Being able to talk.  Performing your usual physical activities.  You have chest pain lasting longer than 5 minutes.  Your skin color is more cyanotic than usual.  You measure low oxygen saturations for longer than 5 minutes with a pulse oximeter. MAKE SURE YOU:  Understand these instructions.  Will watch your condition.  Will get help right away if you are not doing well or get worse.   This information is not intended to replace advice given to you by your health care provider. Make sure you discuss any questions you have with your health care provider.   Document Released: 02/08/2005 Document Revised: 05/22/2014 Document Reviewed: 12/26/2012 Elsevier Interactive Patient Education 2016 [employer] Inc.  

## 2015-06-15 NOTE — Progress Notes (Signed)
Amber [facility]      Date: 06/15/2015,   MRN# [id number] Amber [place] 09-05-1940 Code Status:  Code Status History    Date Active Date Inactive Code Status Order ID Comments User Context   10/04/2014  8:40 PM 10/10/2014  5:17 PM DNR [id number]  [place], MD ED    Questions for Most Recent Historical Code Status (Order [id number])    Question Answer Comment   In the event of cardiac or respiratory ARREST Do not call a "code blue"    In the event of cardiac or respiratory ARREST Do not perform Intubation, CPR, defibrillation or ACLS    In the event of cardiac or respiratory ARREST Use medication by any route, position, wound care, and other measures to relive pain and suffering. May use oxygen, suction and manual treatment of airway obstruction as needed for comfort.      Hosp day:'@LENGTHOFSTAYDAYS'$ @ Referring MD: '@ATDPROV'$ @     PCP:      AdmissionWeight: 99 lb (44.906 kg)                 CurrentWeight: 99 lb (44.906 kg) Amber [facility] is a 75 y.o. old female seen in [place] for lung mass at the request of Dr. Raul [place].     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 Patient has RT lower lobe lung mass I have explained to patient that she will need Bronch for furtyer diagnosis At this time, patient REFUSES to undergo any procedure due to high risk of cardiac and resp complications  Patient has previous  history of Lung cancer [facility] back in 2012 No wheezing, no fevers, no chills at this time   PAST MEDICAL HISTORY   Past Medical History  Diagnosis Date  . COPD (chronic obstructive pulmonary disease) ([place])   . Fibromyalgia   . Migraines   . Hypertension   . High cholesterol   . Heart attack ([place]) 2013  . Cancer ([place]) 2013    lung, breast  . Chronic back pain   . Fracture of lumbar spine ([place])   . Emphysema of lung ([place])   . CHF (congestive heart failure) ([place])      SURGICAL HISTORY   Past Surgical History  Procedure Laterality Date  . Tonsillectomy    . Eye surgery Bilateral   . Mastectomy Bilateral   . Abdominal hysterectomy      total  . Inguinal hernia repair Right   . Kyphoplasty  2012     FAMILY HISTORY   Family History  Problem Relation Age of Onset  . CAD Other   No history of LUNG CANCER IN FAMILY No   SOCIAL HISTORY   Social History  Substance Use Topics  . Smoking status: Current Every Day Smoker -- 0.50 packs/day for 50 years    Types: Cigarettes  . Smokeless tobacco: Never Used  . Alcohol Use: No  MEDICATIONS    Home Medication:  Current Outpatient Rx  Name  Route  Sig  Dispense  Refill  . albuterol (PROVENTIL HFA;VENTOLIN HFA) 108 (90 BASE) MCG/ACT inhaler   Inhalation   Inhale 2 puffs into the lungs every 4 (four) hours as needed for wheezing or shortness of breath.         . cholecalciferol (VITAMIN D) 1000 UNITS tablet   Oral   Take 1,000 Units by mouth daily.         Marland Kitchen docusate sodium (COLACE) 100 MG capsule   Oral   Take 2 capsules (200 mg total) by mouth 2 (two) times daily.   60 capsule   0   . Fluticasone Furoate-Vilanterol 100-25 MCG/INH AEPB   Inhalation   Inhale 1 puff into the lungs daily.         Marland Kitchen guaiFENesin (MUCINEX) 600 MG 12 hr tablet   Oral   Take 600 mg by mouth 2 (two) times daily.         Marland Kitchen lisinopril (PRINIVIL,ZESTRIL) 20 MG tablet   Oral   Take 20 mg by mouth 2 (two) times daily.           . magnesium hydroxide (MILK OF MAGNESIA) 400 MG/5ML suspension   Oral   Take 30 mLs by mouth daily as needed for moderate constipation or indigestion.         . metoprolol succinate (TOPROL-XL) 100 MG 24 hr tablet   Oral   Take 100 mg by mouth daily. Take with or immediately following a meal.         . morphine (MS CONTIN) 30 MG 12 hr tablet   Oral   Take 30 mg by mouth every 12 (twelve) hours.         . nitroGLYCERIN (NITROSTAT) 0.4 MG SL tablet   Sublingual   Place 0.4 mg under the tongue every 5 (five) minutes as needed for chest pain.         Marland Kitchen ondansetron (ZOFRAN) 4 MG tablet   Oral   Take 4 mg by mouth every 8 (eight) hours as needed for nausea or vomiting.         Marland Kitchen oxyCODONE (OXY IR/ROXICODONE) 5 MG immediate release tablet   Oral   Take 10 mg by mouth every 4 (four) hours as needed for severe pain.         . pantoprazole (PROTONIX) 40 MG tablet   Oral   Take 1 tablet (40 mg total) by mouth 2 (two) times daily.   60 tablet   0   . predniSONE (DELTASONE) 5 MG tablet      TAKE ONE (1) TABLET EACH DAY   30 tablet   0   . senna (SENOKOT) 8.6 MG TABS tablet   Oral   Take 1 tablet by mouth at bedtime as needed for mild constipation.         . senna (SENOKOT) 8.6 MG TABS tablet   Oral   Take 1 tablet (8.6 mg total) by mouth 2 (two) times daily.   60 each   0   . sorbitol 70 % solution   Oral   Take 30 mLs by mouth 2 (two) times daily. prn         . tiotropium (SPIRIVA) 18 MCG inhalation capsule   Inhalation   Place 18 mcg into inhaler and inhale daily.         Marland Kitchen torsemide (DEMADEX) 20 MG tablet  Oral   Take 20 mg by mouth daily.         Marland Kitchen aspirin 81 MG tablet   Oral   Take 81 mg by mouth daily. Reported on 06/15/2015           Current Medication:  Current outpatient prescriptions:  .  albuterol (PROVENTIL HFA;VENTOLIN HFA) 108 (90 BASE) MCG/ACT inhaler, Inhale 2 puffs into the lungs every 4 (four) hours as needed for  wheezing or shortness of breath., Disp: , Rfl:  .  cholecalciferol (VITAMIN D) 1000 UNITS tablet, Take 1,000 Units by mouth daily., Disp: , Rfl:  .  docusate sodium (COLACE) 100 MG capsule, Take 2 capsules (200 mg total) by mouth 2 (two) times daily., Disp: 60 capsule, Rfl: 0 .  Fluticasone Furoate-Vilanterol 100-25 MCG/INH AEPB, Inhale 1 puff into the lungs daily., Disp: , Rfl:  .  guaiFENesin (MUCINEX) 600 MG 12 hr tablet, Take 600 mg by mouth 2 (two) times daily., Disp: , Rfl:  .  lisinopril (PRINIVIL,ZESTRIL) 20 MG tablet, Take 20 mg by mouth 2 (two) times daily., Disp: , Rfl:  .  magnesium hydroxide (MILK OF MAGNESIA) 400 MG/5ML suspension, Take 30 mLs by mouth daily as needed for moderate constipation or indigestion., Disp: , Rfl:  .  metoprolol succinate (TOPROL-XL) 100 MG 24 hr tablet, Take 100 mg by mouth daily. Take with or immediately following a meal., Disp: , Rfl:  .  morphine (MS CONTIN) 30 MG 12 hr tablet, Take 30 mg by mouth every 12 (twelve) hours., Disp: , Rfl:  .  nitroGLYCERIN (NITROSTAT) 0.4 MG SL tablet, Place 0.4 mg under the tongue every 5 (five) minutes as needed for chest pain., Disp: , Rfl:  .  ondansetron (ZOFRAN) 4 MG tablet, Take 4 mg by mouth every 8 (eight) hours as needed for nausea or vomiting., Disp: , Rfl:  .  oxyCODONE (OXY IR/ROXICODONE) 5 MG immediate release tablet, Take 10 mg by mouth every 4 (four) hours as needed for severe pain., Disp: , Rfl:  .  pantoprazole (PROTONIX) 40 MG tablet, Take 1 tablet (40 mg total) by mouth 2 (two) times daily., Disp: 60 tablet, Rfl: 0 .  predniSONE (DELTASONE) 5 MG tablet, TAKE ONE (1) TABLET EACH DAY, Disp: 30 tablet, Rfl: 0 .  senna (SENOKOT) 8.6 MG TABS tablet, Take 1 tablet by mouth at bedtime as needed for mild constipation., Disp: , Rfl:  .  senna (SENOKOT) 8.6 MG TABS tablet, Take 1 tablet (8.6 mg total) by mouth 2 (two) times daily., Disp: 60 each, Rfl: 0 .  sorbitol 70 % solution, Take 30 mLs by mouth 2 (two) times  daily. prn, Disp: , Rfl:  .  tiotropium (SPIRIVA) 18 MCG inhalation capsule, Place 18 mcg into inhaler and inhale daily., Disp: , Rfl:  .  torsemide (DEMADEX) 20 MG tablet, Take 20 mg by mouth daily., Disp: , Rfl:  .  aspirin 81 MG tablet, Take 81 mg by mouth daily. Reported on 06/15/2015, Disp: , Rfl:     ALLERGIES   Macrobid     REVIEW OF SYSTEMS   Review of Systems  Constitutional: Negative for fever, chills, weight loss and malaise/fatigue.  HENT: Negative for congestion and hearing loss.   Eyes: Negative for blurred vision and double vision.  Respiratory: Positive for hemoptysis, shortness of breath and wheezing. Negative for cough and sputum production.   Cardiovascular: Negative for chest pain, palpitations and orthopnea.  Gastrointestinal: Positive for nausea and vomiting. Negative for heartburn and  abdominal pain.  Genitourinary: Negative.   Musculoskeletal: Positive for back pain. Negative for myalgias and neck pain.  Skin: Negative for rash.  Neurological: Negative for dizziness, tingling, tremors and headaches.  Endo/Heme/Allergies: Does not bruise/bleed easily.  Psychiatric/Behavioral: Negative for depression, suicidal ideas and substance abuse.  All other systems reviewed and are negative.    VS: BP 128/74 mmHg  Pulse 85  Ht '5\' 5"'$  (1.651 m)  Wt 99 lb (44.906 kg)  BMI 16.47 kg/m2  SpO2 92%     PHYSICAL EXAM  Physical Exam  Constitutional: She is oriented to person, place, and time. No distress.  Thin and frail, on oxygen therapy  HENT:  Head: Normocephalic and atraumatic.  Mouth/Throat: No oropharyngeal exudate.  Eyes: EOM are normal. Pupils are equal, round, and reactive to light.  Neck: Normal range of motion. Neck supple.  Cardiovascular: Normal rate, regular rhythm and normal heart sounds.   No murmur heard. Pulmonary/Chest: No stridor. No respiratory distress. She has wheezes. She has rales.  Abdominal: Soft. Bowel sounds are normal.    Musculoskeletal: Normal range of motion. She exhibits no edema.  Neurological: She is alert and oriented to person, place, and time.  Skin: Skin is warm. She is not diaphoretic.  Psychiatric: She has a normal mood and affect.             IMAGING    Dg Chest 2 View  05/19/2015  CLINICAL DATA:  Cough, emphysema, COPD.  Lung cancer. EXAM: CHEST  2 VIEW COMPARISON:  Chest CT 11/11/2013.  Chest x-ray 12/09/2011. FINDINGS: There is hyperinflation of the lungs compatible with COPD. Marked tortuosity of the thoracic aorta. Heart is normal size. Small right pleural effusion with right lower lobe atelectasis or infiltrate. No acute bony abnormality. Multilevel vertebroplasty changes in the lower thoracic and lumbar spine. Multiple additional compression fractures in the mid and lower thoracic spine. IMPRESSION: COPD. Small right pleural effusion with right lower lobe atelectasis or infiltrate. Electronically Signed   By: Rolm Baptise M.D.   On: 05/19/2015 12:08   Ct Chest W Contrast  05/21/2015  CLINICAL DATA:  Productive cough for 3 weeks with history of lung cancer, patient smokes, also posterior right chest pain with shortness of breath EXAM: CT CHEST WITH CONTRAST TECHNIQUE: Multidetector CT imaging of the chest was performed during intravenous contrast administration. CONTRAST:  3m OMNIPAQUE IOHEXOL 300 MG/ML  SOLN COMPARISON:  Numerous prior studies including chest radiograph 05/18/2014 and CT thorax 11/11/2013 FINDINGS: Ascending aorta is 39 mm in diameter. Descending thoracic aorta is uncoiled and also dilated measuring 4.1 cm in diameter. There is moderate plaque again identified. The aorta is diffusely heavily calcified. Soft tissue fullness in the right hilum difficult to distinguish from more peripheral consolidated lung in the right lower lobe. Abnormal soft tissue measures approximately 26 x 51 mm. Her there is a small loculated right pleural effusion. There is diffuse centrilobular  emphysema and there is bilateral interstitial change. Interstitial changes most pronounced and of moderate severity diffusely on the right. There is an irregular focus of architectural distortion in the right upper lobe measuring about 9 mm. This is similar to the study as is diffuse interstitial change on the right which is not significantly different from November 11, 2016. Milder interstitial changes seen on the left. There is multilevel kyphoplasty. There are numerous other compression deformities in the thoracic spine and lumbar spine. No significant change in these findings except for progression of compression deformity at L1 which is now  all of about 50%. Images through the upper abdomen unremarkable. There is cardiac enlargement and coronary artery calcification. IMPRESSION: Progression of right hilar soft tissue abnormality suggesting adenopathy/ mass with postobstructive atelectasis or pneumonitis in the right lower lobe and small partially loculated right pleural effusion. Stable emphysematous change and interstitial lung disease. Progression of thoracic aortic aneurysm as described. Ascending aorta is 3.9 cm in diameter and descending aorta is no 4.1 cm in diameter. Recommend annual imaging followup by CTA or MRA. This recommendation follows 2010 ACCF/AHA/AATS/ACR/ASA/SCA/SCAI/SIR/STS/SVM Guidelines for the Diagnosis and Management of Patients with Thoracic Aortic Disease. Circulation.2010; 121: K998-P382 Numerous compression deformities with progression of L1 compression deformity. Electronically Signed   By: Skipper Cliche M.D.   On: 05/21/2015 14:19    Images reviewed 06/15/2015      ASSESSMENT/PLAN   75 yo white female with end stage COPD chronic resp failure with new RT lung mass c/w Lung cancer(primary vs recurrence) If patient wants definitive DX,patient will need EBUS/Bronch but patient has high risk for cardiac and Pulm complications from Bronchoscopy. Patient has refused to undergo  Bronch at this time I have suggested to obtain PET scan to assess for metastatic disease and may be assess for biopsy somewhere else  1.obtain PET scan look for Mets 2.continue oxygen therapy 3.continue Breo and SPiriva 4.continue albuterol as needed  Will re-evaluate once PET scan performed  I have discussed that if patient does not undergo procedures for dx, it all depends on growth of tumor as to pertaining to her demise and death   I have personally obtained a history, examined the patient, evaluated laboratory and independently reviewed imaging results, formulated the assessment and plan and placed orders.  The Patient requires high complexity decision making for assessment and support, frequent evaluation and titration of therapies, application of advanced monitoring technologies and extensive interpretation of multiple databases.   Patient/Family are satisfied with Plan of action and management. All questions answered  Corrin Parker, M.D.  [facility]  Medical Director [place] Director [facility] Cardio-Pulmonary Department

## 2015-06-17 ENCOUNTER — Other Ambulatory Visit: Payer: Self-pay | Admitting: Neurosurgery

## 2015-06-17 ENCOUNTER — Ambulatory Visit: Admission: RE | Admit: 2015-06-17 | Payer: Medicare Other | Source: Ambulatory Visit | Admitting: Internal Medicine

## 2015-06-17 ENCOUNTER — Encounter: Admission: RE | Payer: Self-pay | Source: Ambulatory Visit

## 2015-06-17 ENCOUNTER — Other Ambulatory Visit (HOSPITAL_COMMUNITY): Payer: Self-pay | Admitting: Neurosurgery

## 2015-06-17 DIAGNOSIS — M5416 Radiculopathy, lumbar region: Secondary | ICD-10-CM

## 2015-06-17 SURGERY — ENDOBRONCHIAL ULTRASOUND (EBUS)
Anesthesia: General

## 2015-06-22 ENCOUNTER — Ambulatory Visit: Admission: RE | Admit: 2015-06-22 | Payer: Medicare Other | Source: Ambulatory Visit

## 2015-06-23 DIAGNOSIS — R079 Chest pain, unspecified: Secondary | ICD-10-CM | POA: Insufficient documentation

## 2015-06-24 DIAGNOSIS — J90 Pleural effusion, not elsewhere classified: Secondary | ICD-10-CM | POA: Insufficient documentation

## 2015-06-29 ENCOUNTER — Telehealth: Payer: Self-pay | Admitting: *Deleted

## 2015-06-29 NOTE — Telephone Encounter (Signed)
Pt called and wanted her pet scan r/s. She has so much pain that she could not have it done last week.  Dr. Jenell Milliner had ordered it and I called their office and said pt can call and r/s or I could and the auth was good until 3/13.  I called and got pet r/s to 2/20 11 am arrival in medical mall and called house of pt and spoke to husband, the pt was sleeping.  I gave him new date and time and instructions but he already has the paper about it and pt called back date and time to me twice to make sure he has it right.

## 2015-07-05 ENCOUNTER — Ambulatory Visit
Admission: RE | Admit: 2015-07-05 | Discharge: 2015-07-05 | Disposition: A | Discharge status: Home / Self Care | Source: Ambulatory Visit | Attending: Internal Medicine | Admitting: Internal Medicine

## 2015-07-05 DIAGNOSIS — J9811 Atelectasis: Secondary | ICD-10-CM | POA: Insufficient documentation

## 2015-07-05 DIAGNOSIS — K8020 Calculus of gallbladder without cholecystitis without obstruction: Secondary | ICD-10-CM | POA: Diagnosis not present

## 2015-07-05 DIAGNOSIS — I700 Atherosclerosis of aorta: Secondary | ICD-10-CM | POA: Diagnosis not present

## 2015-07-05 DIAGNOSIS — I517 Cardiomegaly: Secondary | ICD-10-CM | POA: Diagnosis not present

## 2015-07-05 DIAGNOSIS — N8110 Cystocele, unspecified: Secondary | ICD-10-CM | POA: Insufficient documentation

## 2015-07-05 DIAGNOSIS — R918 Other nonspecific abnormal finding of lung field: Secondary | ICD-10-CM | POA: Diagnosis present

## 2015-07-05 DIAGNOSIS — K5641 Fecal impaction: Secondary | ICD-10-CM | POA: Insufficient documentation

## 2015-07-05 DIAGNOSIS — E279 Disorder of adrenal gland, unspecified: Secondary | ICD-10-CM | POA: Insufficient documentation

## 2015-07-05 DIAGNOSIS — J189 Pneumonia, unspecified organism: Secondary | ICD-10-CM | POA: Insufficient documentation

## 2015-07-05 DIAGNOSIS — I712 Thoracic aortic aneurysm, without rupture: Secondary | ICD-10-CM | POA: Insufficient documentation

## 2015-07-05 LAB — GLUCOSE, CAPILLARY: GLUCOSE-CAPILLARY: 98 mg/dL (ref 65–99)

## 2015-07-05 MED ORDER — FLUDEOXYGLUCOSE F - 18 (FDG) INJECTION
13.1100 | Freq: Once | INTRAVENOUS | Status: AC | PRN
  Administered 2015-07-05: 13.11 via INTRAVENOUS

## 2015-07-06 ENCOUNTER — Telehealth: Payer: Self-pay

## 2015-07-06 NOTE — Telephone Encounter (Signed)
Pt states she had a PET scan yesterday, and would like results

## 2015-07-06 NOTE — Telephone Encounter (Signed)
Spoke with pt and informed her that [facility] was out of office but I would have another provider to look at the results.  DS will look at pt's PET and let me know what to inform the pt about the results. Thanks.

## 2015-07-07 NOTE — Telephone Encounter (Signed)
I called pt and discussed the PET scan findings with her informing her that the findings are most consistent with cancer. We discussed the optimal approach to this including bronchoscopic biopsy. She again expressed concerns re: anesthesia/sedation but I clarified that it could be relatively light sedation and that the risk would probably be justifiable if it allows for a diagnosis and therapy. I informed her that Dr Mortimer Fries would call her upon his return next week to discuss this further.   Merton Border, MD PCCM service Mobile [phone] Pager [phone]

## 2015-07-07 NOTE — Telephone Encounter (Signed)
Per DS, he will contact pt himself in regards to PET results.

## 2015-07-12 ENCOUNTER — Encounter: Payer: Self-pay | Admitting: *Deleted

## 2015-07-12 NOTE — Telephone Encounter (Signed)
Dr. Alva Garnet spoke with pt last week when she called wanting her PET scan results since you were unavailable. Pt has called back today asking if you can call her in regards to them at some point today. Thanks.

## 2015-07-12 NOTE — Telephone Encounter (Signed)
I have called and updated patient. She will need procedure for def dx. Will discuss case with radiology to assess CT guided access

## 2015-07-12 NOTE — Telephone Encounter (Signed)
Patient called wanting pet scan results. Please call patient.

## 2015-07-12 NOTE — Telephone Encounter (Signed)
Will hold in basket until hear back from [place].

## 2015-07-12 NOTE — Telephone Encounter (Signed)
This encounter was created in error - please disregard.

## 2015-07-17 ENCOUNTER — Other Ambulatory Visit: Payer: Self-pay | Admitting: *Deleted

## 2015-07-17 DIAGNOSIS — C3431 Malignant neoplasm of lower lobe, right bronchus or lung: Secondary | ICD-10-CM

## 2015-07-21 ENCOUNTER — Inpatient Hospital Stay (HOSPITAL_BASED_OUTPATIENT_CLINIC_OR_DEPARTMENT_OTHER): Payer: Medicare Other | Admitting: Internal Medicine

## 2015-07-21 ENCOUNTER — Inpatient Hospital Stay: Payer: Medicare Other | Attending: Internal Medicine

## 2015-07-21 ENCOUNTER — Telehealth: Payer: Self-pay | Admitting: *Deleted

## 2015-07-21 ENCOUNTER — Encounter: Payer: Self-pay | Admitting: Internal Medicine

## 2015-07-21 VITALS — BP 108/67 | HR 86 | Temp 97.8°F | Wt 93.9 lb

## 2015-07-21 DIAGNOSIS — G8929 Other chronic pain: Secondary | ICD-10-CM | POA: Insufficient documentation

## 2015-07-21 DIAGNOSIS — R5381 Other malaise: Secondary | ICD-10-CM | POA: Insufficient documentation

## 2015-07-21 DIAGNOSIS — Z79899 Other long term (current) drug therapy: Secondary | ICD-10-CM | POA: Diagnosis not present

## 2015-07-21 DIAGNOSIS — R531 Weakness: Secondary | ICD-10-CM | POA: Diagnosis not present

## 2015-07-21 DIAGNOSIS — E7800 Pure hypercholesterolemia, unspecified: Secondary | ICD-10-CM | POA: Insufficient documentation

## 2015-07-21 DIAGNOSIS — Z85118 Personal history of other malignant neoplasm of bronchus and lung: Secondary | ICD-10-CM

## 2015-07-21 DIAGNOSIS — J449 Chronic obstructive pulmonary disease, unspecified: Secondary | ICD-10-CM | POA: Insufficient documentation

## 2015-07-21 DIAGNOSIS — M797 Fibromyalgia: Secondary | ICD-10-CM

## 2015-07-21 DIAGNOSIS — I252 Old myocardial infarction: Secondary | ICD-10-CM | POA: Diagnosis not present

## 2015-07-21 DIAGNOSIS — R634 Abnormal weight loss: Secondary | ICD-10-CM | POA: Insufficient documentation

## 2015-07-21 DIAGNOSIS — R918 Other nonspecific abnormal finding of lung field: Secondary | ICD-10-CM | POA: Diagnosis not present

## 2015-07-21 DIAGNOSIS — J42 Unspecified chronic bronchitis: Secondary | ICD-10-CM

## 2015-07-21 DIAGNOSIS — J90 Pleural effusion, not elsewhere classified: Secondary | ICD-10-CM | POA: Insufficient documentation

## 2015-07-21 DIAGNOSIS — R630 Anorexia: Secondary | ICD-10-CM | POA: Diagnosis not present

## 2015-07-21 DIAGNOSIS — F17210 Nicotine dependence, cigarettes, uncomplicated: Secondary | ICD-10-CM | POA: Insufficient documentation

## 2015-07-21 DIAGNOSIS — M549 Dorsalgia, unspecified: Secondary | ICD-10-CM | POA: Insufficient documentation

## 2015-07-21 DIAGNOSIS — R0602 Shortness of breath: Secondary | ICD-10-CM | POA: Diagnosis not present

## 2015-07-21 DIAGNOSIS — R05 Cough: Secondary | ICD-10-CM | POA: Insufficient documentation

## 2015-07-21 DIAGNOSIS — I10 Essential (primary) hypertension: Secondary | ICD-10-CM | POA: Insufficient documentation

## 2015-07-21 DIAGNOSIS — C3431 Malignant neoplasm of lower lobe, right bronchus or lung: Secondary | ICD-10-CM

## 2015-07-21 LAB — CBC WITH DIFFERENTIAL/PLATELET
BASOS ABS: 0.1 10*3/uL (ref 0–0.1)
BASOS PCT: 1 %
EOS ABS: 0 10*3/uL (ref 0–0.7)
Eosinophils Relative: 0 %
HEMATOCRIT: 42.4 % (ref 35.0–47.0)
HEMOGLOBIN: 14.7 g/dL (ref 12.0–16.0)
Lymphocytes Relative: 10 %
Lymphs Abs: 0.9 10*3/uL — ABNORMAL LOW (ref 1.0–3.6)
MCH: 34.3 pg — ABNORMAL HIGH (ref 26.0–34.0)
MCHC: 34.6 g/dL (ref 32.0–36.0)
MCV: 99.2 fL (ref 80.0–100.0)
Monocytes Absolute: 1 10*3/uL — ABNORMAL HIGH (ref 0.2–0.9)
Monocytes Relative: 11 %
NEUTROS ABS: 6.7 10*3/uL — AB (ref 1.4–6.5)
NEUTROS PCT: 78 %
Platelets: 229 10*3/uL (ref 150–440)
RBC: 4.27 MIL/uL (ref 3.80–5.20)
RDW: 14.3 % (ref 11.5–14.5)
WBC: 8.8 10*3/uL (ref 3.6–11.0)

## 2015-07-21 LAB — COMPREHENSIVE METABOLIC PANEL
ALBUMIN: 3.6 g/dL (ref 3.5–5.0)
ALK PHOS: 66 U/L (ref 38–126)
ALT: 12 U/L — ABNORMAL LOW (ref 14–54)
ANION GAP: 6 (ref 5–15)
AST: 15 U/L (ref 15–41)
BILIRUBIN TOTAL: 1.2 mg/dL (ref 0.3–1.2)
BUN: 22 mg/dL — AB (ref 6–20)
CALCIUM: 8.7 mg/dL — AB (ref 8.9–10.3)
CO2: 45 mmol/L — AB (ref 22–32)
Chloride: 84 mmol/L — ABNORMAL LOW (ref 101–111)
Creatinine, Ser: 0.74 mg/dL (ref 0.44–1.00)
GFR calc Af Amer: >60 mL/min (ref >60)
GFR calc non Af Amer: >60 mL/min (ref >60)
GLUCOSE: 108 mg/dL — AB (ref 65–99)
Potassium: 2.7 mmol/L — CL (ref 3.5–5.1)
SODIUM: 135 mmol/L (ref 135–145)
TOTAL PROTEIN: 6.9 g/dL (ref 6.5–8.1)

## 2015-07-21 MED ORDER — PREDNISONE 5 MG PO TABS: 5.0000 mg | ORAL_TABLET | Freq: Every day | ORAL | Status: AC

## 2015-07-21 MED ORDER — POTASSIUM CHLORIDE CRYS ER 20 MEQ PO TBCR: 20.0000 meq | EXTENDED_RELEASE_TABLET | Freq: Two times a day (BID) | ORAL | Status: DC

## 2015-07-21 NOTE — Progress Notes (Signed)
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 I reviewed the patient's prior charts/pertinent labs/imaging in detail; findings are summarized above.   A very pleasant but unfortunate 75 year old female patient  Caucasian female patient the above history of squamous cell cancer likely stage III in 2012;  Is here to review the  The next step in the management of her possible recurrent lung cancer.   Patient was felt to be high risk for biopsy-  Given her COPD;  Hence biopsy not done.   Patient continues to have chronic shortness of breath;  Chronic cough.  She is tired. Poor appetite.  No nausea vomiting.  She feels weak all over.  No unusual headaches or vision changes.  REVIEW OF SYSTEMS:  A complete 10 point review of system is done which is negative except mentioned above/history of present illness.   PAST MEDICAL HISTORY :  Past Medical History  Diagnosis Date  . COPD (chronic obstructive pulmonary disease) ([place])   . Fibromyalgia   . Migraines   . Hypertension   . High cholesterol   . Heart attack ([place]) 2013  . Cancer ([place]) 2013    lung, breast  . Chronic back pain   . Fracture of lumbar spine ([place])   . Emphysema of lung ([place])   . CHF (congestive heart failure) ([place])   . Lung cancer ([place])     PAST SURGICAL HISTORY :   Past Surgical History  Procedure Laterality Date  . Tonsillectomy    . Eye surgery Bilateral   . Mastectomy Bilateral   . Abdominal hysterectomy       total  . Inguinal hernia repair Right   . Kyphoplasty  2012    FAMILY HISTORY :   Family History  Problem Relation Age of Onset  . CAD Other     SOCIAL HISTORY:   Social History  Substance Use Topics  . Smoking status: Current Every Day Smoker -- 0.50 packs/day for 50 years    Types: Cigarettes  . Smokeless tobacco: Never Used  . Alcohol Use: No    ALLERGIES:  is allergic to aspirin; atorvastatin; ciprofloxacin; macrobid; and methadone.  MEDICATIONS:  Current Outpatient Prescriptions  Medication Sig Dispense Refill  . albuterol (PROVENTIL HFA;VENTOLIN HFA) 108 (90 BASE) MCG/ACT inhaler Inhale 2 puffs into the lungs every 4 (four) hours as needed for wheezing or shortness of breath.    . cholecalciferol (VITAMIN D) 1000 UNITS tablet Take 1,000 Units by mouth daily.    Marland Kitchen docusate sodium (COLACE) 100 MG capsule Take 2 capsules (200 mg total) by mouth 2 (two) times daily. 60 capsule 0  . Fluticasone Furoate-Vilanterol 100-25 MCG/INH AEPB Inhale 1 puff into the lungs daily.    Marland Kitchen guaiFENesin (MUCINEX) 600 MG 12 hr tablet Take 600 mg by mouth 2 (two) times daily.    Marland Kitchen lisinopril (PRINIVIL,ZESTRIL) 20 MG tablet Take 20 mg by mouth 2 (two) times daily.    . magnesium hydroxide (MILK  OF MAGNESIA) 400 MG/5ML suspension Take 30 mLs by mouth daily as needed for moderate constipation or indigestion.    . metoprolol succinate (TOPROL-XL) 100 MG 24 hr tablet Take 100 mg by mouth daily. Take with or immediately following a meal.    . morphine (MS CONTIN) 30 MG 12 hr tablet Take 30 mg by mouth every 12 (twelve) hours.    . nitroGLYCERIN (NITROSTAT) 0.4 MG SL tablet Place 0.4 mg under the tongue every 5 (five) minutes as needed for chest pain.    Marland Kitchen ondansetron (ZOFRAN) 4 MG tablet Take 1 tablet (4 mg total) by mouth every 6 (six) hours as needed for nausea or vomiting. 30 tablet 0  . oxyCODONE (OXY IR/ROXICODONE) 5 MG immediate release tablet Take 10 mg by mouth every 4 (four) hours as needed  for severe pain.    . pantoprazole (PROTONIX) 40 MG tablet Take 1 tablet (40 mg total) by mouth 2 (two) times daily. 60 tablet 0  . pravastatin (PRAVACHOL) 20 MG tablet   11  . predniSONE (DELTASONE) 5 MG tablet Take 1 tablet (5 mg total) by mouth daily with breakfast. 60 tablet 0  . senna (SENOKOT) 8.6 MG TABS tablet Take 1 tablet by mouth at bedtime as needed for mild constipation.    . senna (SENOKOT) 8.6 MG TABS tablet Take 1 tablet (8.6 mg total) by mouth 2 (two) times daily. 60 each 0  . sorbitol 70 % solution Take 30 mLs by mouth 2 (two) times daily. prn    . tiotropium (SPIRIVA) 18 MCG inhalation capsule Place 18 mcg into inhaler and inhale daily.    Marland Kitchen torsemide (DEMADEX) 20 MG tablet Take 20 mg by mouth daily.    . potassium chloride SA (K-DUR,KLOR-CON) 20 MEQ tablet Take 1 tablet (20 mEq total) by mouth 2 (two) times daily. 20 tablet 0   No current facility-administered medications for this visit.    PHYSICAL EXAMINATION: ECOG PERFORMANCE STATUS: 3 - Symptomatic, >50% confined to bed  BP 108/67 mmHg  Pulse 86  Temp(Src) 97.8 F (36.6 C) (Tympanic)  Wt 93 lb 14.7 oz (42.6 kg)  SpO2 92%  Filed Weights   07/21/15 1159  Weight: 93 lb 14.7 oz (42.6 kg)    GENERAL: cachectic;  Frail-appearing Caucasian female patient;  In a wheelchair;  O2 nasal cannula oxygen.  She is accompanied by multiple family members. EYES: no pallor or icterus OROPHARYNX: no thrush or ulceration NECK: supple, no masses felt LYMPH:  no palpable lymphadenopathy in the cervical, axillary or inguinal regions LUNGS:  Decreased air entry  Left more than right. HEART/CVS: regular rate & rhythm and no murmurs; No lower extremity edema ABDOMEN:abdomen soft, non-tender and normal bowel sounds Musculoskeletal:no cyanosis of digits and no clubbing  PSYCH: alert & oriented x 3 with fluent speech NEURO: no focal motor/sensory deficits SKIN:  no rashes or significant lesions  LABORATORY DATA:  I have reviewed  the data as listed    Component Value Date/Time   NA 135 07/21/2015 1112   NA 144 03/23/2014 1413   K 2.7* 07/21/2015 1112   K 3.6 03/23/2014 1413   CL 84* 07/21/2015 1112   CL 102 03/23/2014 1413   CO2 45* 07/21/2015 1112   CO2 37* 03/23/2014 1413   GLUCOSE 108* 07/21/2015 1112   GLUCOSE 107* 03/23/2014 1413   BUN 22* 07/21/2015 1112   BUN 12 03/23/2014 1413   CREATININE 0.74 07/21/2015 1112   CREATININE 0.74 03/23/2014 1413   CALCIUM  8.7* 07/21/2015 1112   CALCIUM 9.5 03/23/2014 1413   PROT 6.9 07/21/2015 1112   PROT 7.0 03/23/2014 1413   ALBUMIN 3.6 07/21/2015 1112   ALBUMIN 3.7 03/23/2014 1413   AST 15 07/21/2015 1112   AST 14* 03/23/2014 1413   ALT 12* 07/21/2015 1112   ALT 17 03/23/2014 1413   ALKPHOS 66 07/21/2015 1112   ALKPHOS 60 03/23/2014 1413   BILITOT 1.2 07/21/2015 1112   BILITOT 0.8 03/23/2014 1413   GFRNONAA >60 07/21/2015 1112   GFRNONAA >60 03/23/2014 1413   GFRNONAA >60 09/24/2013 1431   GFRAA >60 07/21/2015 1112   GFRAA >60 03/23/2014 1413   GFRAA >60 09/24/2013 1431    No results found for: SPEP, UPEP  Lab Results  Component Value Date   WBC 8.8 07/21/2015   NEUTROABS 6.7* 07/21/2015   HGB 14.7 07/21/2015   HCT 42.4 07/21/2015   MCV 99.2 07/21/2015   PLT 229 07/21/2015      Chemistry      Component Value Date/Time   NA 135 07/21/2015 1112   NA 144 03/23/2014 1413   K 2.7* 07/21/2015 1112   K 3.6 03/23/2014 1413   CL 84* 07/21/2015 1112   CL 102 03/23/2014 1413   CO2 45* 07/21/2015 1112   CO2 37* 03/23/2014 1413   BUN 22* 07/21/2015 1112   BUN 12 03/23/2014 1413   CREATININE 0.74 07/21/2015 1112   CREATININE 0.74 03/23/2014 1413      Component Value Date/Time   CALCIUM 8.7* 07/21/2015 1112   CALCIUM 9.5 03/23/2014 1413   ALKPHOS 66 07/21/2015 1112   ALKPHOS 60 03/23/2014 1413   AST 15 07/21/2015 1112   AST 14* 03/23/2014 1413   ALT 12* 07/21/2015 1112   ALT 17 03/23/2014 1413   BILITOT 1.2 07/21/2015 1112   BILITOT 0.8  03/23/2014 1413         ASSESSMENT & PLAN:   #  History of squamous cell lung cancer stage III 2012;  PET scan-  Highly Concerning for  Recurrence-  Right upper lobe/ hilar mass/  Small pleural effusion on the right side/  Adrenal lesion; 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  Patient is considered high-risk for biopsy.  And even if patient does undergo biopsy;  And malignancy is confirmed-  I think patient is a poor candidate for any systemic therapies.  I'm concerned about the side effects  over the potential benefits of the treatments.  #  I spoke to Dr. Donella Stade;  Who agrees to evaluate the patient the next few days to  See the patient is a candidate for any  Palliative radiation.   #   Given the poor performance status/ severe co-morbidities-  I would  Continued hospice.  I discussed that the potential life expectancy is in the order of a few months.  I also discussed the potential  Symptoms to expect from progression of malignancy including-  Pleural effusion was in shortness of breath [ which might need thoracentesis];  And also poor appetite;  And pain.  #  Poor appetite weight loss;  Recommend prednisone 5 mg once a day;  If needed 2 pills a day.   #  I have not made any follow-up appointments;  She'll call us if she needs any appointments.  # 40 minutes face-to-face with the patient discussing the above plan of care; more than 50% of time spent on prognosis/ natural history; counseling and coordination.       Cammie Sickle, MD 07/21/2015  1:04 PM

## 2015-07-21 NOTE — Telephone Encounter (Signed)
I called pt back and said that Dr. B spoke to chrystal and felt like there may be something he can offer. Wants to see pt and next available appt is 3/15 2 pm. Pt agreeable to this.  Pt had requested a copy of dr B note today so she can read over and if any questions can call me.  i printed them and put in mail for pt.  Also hospice would like a copy of last 2 notes and it has been sent to their office

## 2015-07-22 ENCOUNTER — Other Ambulatory Visit: Payer: Medicare Other

## 2015-07-22 ENCOUNTER — Ambulatory Visit: Payer: Medicare Other | Admitting: Internal Medicine

## 2015-07-28 ENCOUNTER — Other Ambulatory Visit: Payer: Self-pay | Admitting: Internal Medicine

## 2015-07-28 ENCOUNTER — Ambulatory Visit
Admission: RE | Admit: 2015-07-28 | Discharge: 2015-07-28 | Disposition: A | Discharge status: Home / Self Care | Source: Ambulatory Visit | Attending: Radiation Oncology | Admitting: Radiation Oncology

## 2015-07-28 ENCOUNTER — Encounter: Payer: Self-pay | Admitting: Radiation Oncology

## 2015-07-28 VITALS — BP 153/98 | HR 97 | Temp 96.4°F

## 2015-07-28 DIAGNOSIS — C3491 Malignant neoplasm of unspecified part of right bronchus or lung: Secondary | ICD-10-CM | POA: Insufficient documentation

## 2015-07-28 DIAGNOSIS — R918 Other nonspecific abnormal finding of lung field: Secondary | ICD-10-CM

## 2015-07-28 DIAGNOSIS — Z510 Encounter for antineoplastic radiation therapy: Secondary | ICD-10-CM | POA: Insufficient documentation

## 2015-07-28 NOTE — Consult Note (Signed)
Except an outstanding is perfect of Radiation Oncology NEW PATIENT EVALUATION  Name: Amber Harrison  MRN: [id number]  Date:   07/28/2015     DOB: [birth year]   This 75 y.o. female patient presents to the clinic for initial evaluation of recurrent lung cancer to right lung status post concurrent chemoradiation back in 2012 for squamous cell carcinoma of the right lung and hilum.  REFERRING PHYSICIAN: Ellamae Sia, MD  CHIEF COMPLAINT:  Chief Complaint  Patient presents with  . Lung Cancer    Initial evaluation of radiation therapy    DIAGNOSIS: The encounter diagnosis was Lung mass.   PREVIOUS INVESTIGATIONS:  PET scan CT scans reviewed No biopsy performed based on patient's overall performance status Clinical notes reviewed  HPI: Patient is a 75 year old female well known to our department having been treated back in July 2012 for stage III squamous cell carcinoma of the right lung status post concurrent chemotherapy radiation. She recently has been followed by medical oncology repeat imaging shows progressive right hilar mass with a small right pleural effusion. Patient is currently on hospice care for her overall poor performance status with significant COPD and his nasal oxygen dependent. Not thought to be suitable for biopsy. PET CT was performed showing 5.4 cm hyperbolic mass in the right lower lobe with associated atelectasis of the right lower lobe and postobstructive pneumonitis. She also has hypermetabolic right hilar and infrahilar lymph nodes compatible with malignancy. There is also hypermetabolic activity in the right adrenal gland again compatible with malignancy. She does have significant emphysema also thoracic aortic aneurysm noted. Case was reviewed with medical oncology and I been asked to evaluate the patient for possible palliative radiation therapy to her right chest. This is in the area of prior radiation treatment. She is frail although doing well. Her by mouth  intake is poor she specifically denies cough hemoptysis or marked chest tightness.  PLANNED TREATMENT REGIMEN: Salvage radiation  PAST MEDICAL HISTORY:  has a past medical history of COPD (chronic obstructive pulmonary disease) ([place]); Fibromyalgia; Migraines; Hypertension; High cholesterol; Heart attack ([place]) (2013); Cancer [facility]) (2013); Chronic back pain; Fracture of lumbar spine ([place]); Emphysema of lung ([place]); CHF (congestive heart failure) ([place]); and Lung cancer ([place]).    PAST SURGICAL HISTORY:  Past Surgical History  Procedure Laterality Date  . Tonsillectomy    . Eye surgery Bilateral   . Mastectomy Bilateral   . Abdominal hysterectomy      total  . Inguinal hernia repair Right   . Kyphoplasty  2012    FAMILY HISTORY: family history includes CAD in her other.  SOCIAL HISTORY:  reports that she has been smoking Cigarettes.  She has a 25 pack-year smoking history. She has never used smokeless tobacco. She reports that she does not drink alcohol or use illicit drugs.  ALLERGIES: Aspirin; Atorvastatin; Ciprofloxacin; Macrobid; and Methadone  MEDICATIONS:  Current Outpatient Prescriptions  Medication Sig Dispense Refill  . albuterol (PROVENTIL HFA;VENTOLIN HFA) 108 (90 BASE) MCG/ACT inhaler Inhale 2 puffs into the lungs every 4 (four) hours as needed for wheezing or shortness of breath.    . cholecalciferol (VITAMIN D) 1000 UNITS tablet Take 1,000 Units by mouth daily.    Marland Kitchen docusate sodium (COLACE) 100 MG capsule Take 2 capsules (200 mg total) by mouth 2 (two) times daily. 60 capsule 0  . Fluticasone Furoate-Vilanterol 100-25 MCG/INH AEPB Inhale 1 puff into the lungs daily.    Marland Kitchen guaiFENesin (MUCINEX) 600 MG 12 hr tablet Take 600  mg by mouth 2 (two) times daily.    Marland Kitchen lisinopril (PRINIVIL,ZESTRIL) 20 MG tablet Take 20 mg by mouth 2 (two) times daily.    . magnesium hydroxide (MILK OF MAGNESIA) 400 MG/5ML suspension Take 30 mLs by mouth daily as needed for moderate constipation or  indigestion.    . metoprolol succinate (TOPROL-XL) 100 MG 24 hr tablet Take 100 mg by mouth daily. Take with or immediately following a meal.    . morphine (MS CONTIN) 30 MG 12 hr tablet Take 30 mg by mouth every 12 (twelve) hours.    . nitroGLYCERIN (NITROSTAT) 0.4 MG SL tablet Place 0.4 mg under the tongue every 5 (five) minutes as needed for chest pain.    Marland Kitchen ondansetron (ZOFRAN) 4 MG tablet Take 1 tablet (4 mg total) by mouth every 6 (six) hours as needed for nausea or vomiting. 30 tablet 0  . oxyCODONE (OXY IR/ROXICODONE) 5 MG immediate release tablet Take 10 mg by mouth every 4 (four) hours as needed for severe pain.    . pantoprazole (PROTONIX) 40 MG tablet Take 1 tablet (40 mg total) by mouth 2 (two) times daily. 60 tablet 0  . potassium chloride SA (K-DUR,KLOR-CON) 20 MEQ tablet Take 1 tablet (20 mEq total) by mouth 2 (two) times daily. 20 tablet 0  . pravastatin (PRAVACHOL) 20 MG tablet   11  . predniSONE (DELTASONE) 5 MG tablet Take 1 tablet (5 mg total) by mouth daily with breakfast. 60 tablet 0  . senna (SENOKOT) 8.6 MG TABS tablet Take 1 tablet by mouth at bedtime as needed for mild constipation.    . senna (SENOKOT) 8.6 MG TABS tablet Take 1 tablet (8.6 mg total) by mouth 2 (two) times daily. 60 each 0  . sorbitol 70 % solution Take 30 mLs by mouth 2 (two) times daily. prn    . tiotropium (SPIRIVA) 18 MCG inhalation capsule Place 18 mcg into inhaler and inhale daily.    Marland Kitchen torsemide (DEMADEX) 20 MG tablet Take 20 mg by mouth daily.     No current facility-administered medications for this encounter.    ECOG PERFORMANCE STATUS:  2 - Symptomatic, <50% confined to bed  REVIEW OF SYSTEMS: Except for overall poor performance status  Patient denies any weight loss, fatigue, weakness, fever, chills or night sweats. Patient denies any loss of vision, blurred vision. Patient denies any ringing  of the ears or hearing loss. No irregular heartbeat. Patient denies heart murmur or history of  fainting. Patient denies any chest pain or pain radiating to her upper extremities. Patient denies any shortness of breath, difficulty breathing at night, cough or hemoptysis. Patient denies any swelling in the lower legs. Patient denies any nausea vomiting, vomiting of blood, or coffee ground material in the vomitus. Patient denies any stomach pain. Patient states has had normal bowel movements no significant constipation or diarrhea. Patient denies any dysuria, hematuria or significant nocturia. Patient denies any problems walking, swelling in the joints or loss of balance. Patient denies any skin changes, loss of hair or loss of weight. Patient denies any excessive worrying or anxiety or significant depression. Patient denies any problems with insomnia. Patient denies excessive thirst, polyuria, polydipsia. Patient denies any swollen glands, patient denies easy bruising or easy bleeding. Patient denies any recent infections, allergies or URI. Patient "s visual fields have not changed significantly in recent time.    PHYSICAL EXAM: BP 153/98 mmHg  Pulse 97  Temp(Src) 96.4 F (35.8 C) Thin frail female in  NAD on nasal oxygen wheelchair-bound. Lungs are clear to A&P no cervical or supraclavicular adenopathy is appreciated. Well-developed well-nourished patient in NAD. HEENT reveals PERLA, EOMI, discs not visualized.  Oral cavity is clear. No oral mucosal lesions are identified. Neck is clear without evidence of cervical or supraclavicular adenopathy. Lungs are clear to A&P. Cardiac examination is essentially unremarkable with regular rate and rhythm without murmur rub or thrill. Abdomen is benign with no organomegaly or masses noted. Motor sensory and DTR levels are equal and symmetric in the upper and lower extremities. Cranial nerves II through XII are grossly intact. Proprioception is intact. No peripheral adenopathy or edema is identified. No motor or sensory levels are noted. Crude visual fields are  within normal range.  LABORATORY DATA: Agree with poor candidate for lung biopsy    RADIOLOGY RESULTS: PET CT scans and prior scans are reviewed compatible above-stated findings.   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  PLAN: At this time I to go ahead with the palliative course of radiation therapy to her right lung. Would use PET CT fusion and deliver 3000 cGy in 15 fractions. Based on previous radiation to this area would try to use I am RT treatment planning and delivery to spare critical structures such as her spinal cord esophagus heart and normal lung volume. Risks and benefits of treatment including increased cough possible dysphasia alteration of blood counts skin reaction fatigue all were discussed in detail with the patient and her family. They have consented to treatment and I have set up personally and ordered CT simulation later this week.  I would like to take this opportunity for allowing me to participate in the care of your patient.Armstead Peaks., MD

## 2015-07-29 ENCOUNTER — Telehealth: Payer: Self-pay | Admitting: *Deleted

## 2015-07-29 ENCOUNTER — Other Ambulatory Visit: Payer: Self-pay | Admitting: *Deleted

## 2015-07-29 DIAGNOSIS — C3491 Malignant neoplasm of unspecified part of right bronchus or lung: Secondary | ICD-10-CM

## 2015-07-29 NOTE — Telephone Encounter (Signed)
Pt called me to say that dr Baruch Gouty can do radiation to her lung.  I checked with Dr/ B and he said to see her in 8 weeks and that way she would get simulated, have 3 weeks of radiaiton and then it will be 2 weeks post radiation.  Her appt is 5/10 at 2 pm for labs and then see md. Pt agreeable to the appt

## 2015-07-30 ENCOUNTER — Ambulatory Visit
Admission: RE | Admit: 2015-07-30 | Discharge: 2015-07-30 | Disposition: A | Discharge status: Home / Self Care | Source: Ambulatory Visit | Attending: Radiation Oncology | Admitting: Radiation Oncology

## 2015-07-30 DIAGNOSIS — Z510 Encounter for antineoplastic radiation therapy: Secondary | ICD-10-CM | POA: Diagnosis present

## 2015-07-30 DIAGNOSIS — C3491 Malignant neoplasm of unspecified part of right bronchus or lung: Secondary | ICD-10-CM | POA: Diagnosis not present

## 2015-08-06 ENCOUNTER — Other Ambulatory Visit: Payer: Self-pay | Admitting: *Deleted

## 2015-08-06 DIAGNOSIS — C3401 Malignant neoplasm of right main bronchus: Secondary | ICD-10-CM

## 2015-08-07 DIAGNOSIS — Z510 Encounter for antineoplastic radiation therapy: Secondary | ICD-10-CM | POA: Diagnosis not present

## 2015-08-09 ENCOUNTER — Other Ambulatory Visit: Payer: Self-pay | Admitting: Internal Medicine

## 2015-08-09 DIAGNOSIS — Z510 Encounter for antineoplastic radiation therapy: Secondary | ICD-10-CM | POA: Diagnosis not present

## 2015-08-09 NOTE — Telephone Encounter (Signed)
     Ref Range 2wk ago  24moago     Sodium 135 - 145 mmol/L 135 137    Potassium 3.5 - 5.1 mmol/L 2.7 3.7

## 2015-08-10 ENCOUNTER — Ambulatory Visit

## 2015-08-10 ENCOUNTER — Ambulatory Visit
Admission: RE | Admit: 2015-08-10 | Discharge: 2015-08-10 | Disposition: A | Discharge status: Home / Self Care | Source: Ambulatory Visit | Attending: Radiation Oncology | Admitting: Radiation Oncology

## 2015-08-11 ENCOUNTER — Ambulatory Visit

## 2015-08-11 DIAGNOSIS — Z510 Encounter for antineoplastic radiation therapy: Secondary | ICD-10-CM | POA: Diagnosis not present

## 2015-08-12 ENCOUNTER — Ambulatory Visit

## 2015-08-13 ENCOUNTER — Other Ambulatory Visit: Payer: Self-pay | Admitting: Internal Medicine

## 2015-08-13 ENCOUNTER — Ambulatory Visit

## 2015-08-16 ENCOUNTER — Ambulatory Visit

## 2015-08-16 ENCOUNTER — Ambulatory Visit
Admission: RE | Admit: 2015-08-16 | Discharge: 2015-08-16 | Disposition: A | Discharge status: Home / Self Care | Source: Ambulatory Visit | Attending: Radiation Oncology | Admitting: Radiation Oncology

## 2015-08-17 ENCOUNTER — Ambulatory Visit
Admission: RE | Admit: 2015-08-17 | Discharge: 2015-08-17 | Disposition: A | Discharge status: Home / Self Care | Source: Ambulatory Visit | Attending: Radiation Oncology | Admitting: Radiation Oncology

## 2015-08-17 DIAGNOSIS — Z510 Encounter for antineoplastic radiation therapy: Secondary | ICD-10-CM | POA: Diagnosis not present

## 2015-08-18 ENCOUNTER — Ambulatory Visit
Admission: RE | Admit: 2015-08-18 | Discharge: 2015-08-18 | Disposition: A | Discharge status: Home / Self Care | Source: Ambulatory Visit | Attending: Radiation Oncology | Admitting: Radiation Oncology

## 2015-08-18 ENCOUNTER — Inpatient Hospital Stay: Attending: Internal Medicine

## 2015-08-18 ENCOUNTER — Telehealth: Payer: Self-pay | Admitting: *Deleted

## 2015-08-18 DIAGNOSIS — Z85118 Personal history of other malignant neoplasm of bronchus and lung: Secondary | ICD-10-CM | POA: Insufficient documentation

## 2015-08-18 DIAGNOSIS — I252 Old myocardial infarction: Secondary | ICD-10-CM | POA: Insufficient documentation

## 2015-08-18 DIAGNOSIS — Z90710 Acquired absence of both cervix and uterus: Secondary | ICD-10-CM | POA: Insufficient documentation

## 2015-08-18 DIAGNOSIS — R0602 Shortness of breath: Secondary | ICD-10-CM | POA: Insufficient documentation

## 2015-08-18 DIAGNOSIS — Z510 Encounter for antineoplastic radiation therapy: Secondary | ICD-10-CM | POA: Diagnosis not present

## 2015-08-18 DIAGNOSIS — Z79899 Other long term (current) drug therapy: Secondary | ICD-10-CM | POA: Insufficient documentation

## 2015-08-18 DIAGNOSIS — R0789 Other chest pain: Secondary | ICD-10-CM | POA: Insufficient documentation

## 2015-08-18 DIAGNOSIS — R5381 Other malaise: Secondary | ICD-10-CM | POA: Insufficient documentation

## 2015-08-18 DIAGNOSIS — R109 Unspecified abdominal pain: Secondary | ICD-10-CM | POA: Insufficient documentation

## 2015-08-18 DIAGNOSIS — M797 Fibromyalgia: Secondary | ICD-10-CM | POA: Insufficient documentation

## 2015-08-18 DIAGNOSIS — J449 Chronic obstructive pulmonary disease, unspecified: Secondary | ICD-10-CM | POA: Insufficient documentation

## 2015-08-18 DIAGNOSIS — Z7952 Long term (current) use of systemic steroids: Secondary | ICD-10-CM | POA: Insufficient documentation

## 2015-08-18 DIAGNOSIS — Z9013 Acquired absence of bilateral breasts and nipples: Secondary | ICD-10-CM | POA: Insufficient documentation

## 2015-08-18 DIAGNOSIS — R112 Nausea with vomiting, unspecified: Secondary | ICD-10-CM | POA: Insufficient documentation

## 2015-08-18 DIAGNOSIS — I509 Heart failure, unspecified: Secondary | ICD-10-CM | POA: Insufficient documentation

## 2015-08-18 DIAGNOSIS — R630 Anorexia: Secondary | ICD-10-CM | POA: Insufficient documentation

## 2015-08-18 DIAGNOSIS — R5383 Other fatigue: Secondary | ICD-10-CM | POA: Insufficient documentation

## 2015-08-18 DIAGNOSIS — J90 Pleural effusion, not elsewhere classified: Secondary | ICD-10-CM | POA: Insufficient documentation

## 2015-08-18 DIAGNOSIS — F17210 Nicotine dependence, cigarettes, uncomplicated: Secondary | ICD-10-CM | POA: Insufficient documentation

## 2015-08-18 DIAGNOSIS — R093 Abnormal sputum: Secondary | ICD-10-CM | POA: Insufficient documentation

## 2015-08-18 DIAGNOSIS — E7800 Pure hypercholesterolemia, unspecified: Secondary | ICD-10-CM | POA: Insufficient documentation

## 2015-08-18 DIAGNOSIS — R05 Cough: Secondary | ICD-10-CM | POA: Insufficient documentation

## 2015-08-18 DIAGNOSIS — R918 Other nonspecific abnormal finding of lung field: Secondary | ICD-10-CM | POA: Insufficient documentation

## 2015-08-18 DIAGNOSIS — R634 Abnormal weight loss: Secondary | ICD-10-CM | POA: Insufficient documentation

## 2015-08-18 DIAGNOSIS — I10 Essential (primary) hypertension: Secondary | ICD-10-CM | POA: Insufficient documentation

## 2015-08-18 NOTE — Telephone Encounter (Signed)
Pt called with low BP of 75/51.  She held her BP medication today.  She has not been eating/drinking well for several days.  Heather RN notified.  Infusion did not have any chairs available so patient is to push fluids at home today and will get IV fluids tomorrow per Nira Conn and Dr. Jacinto Reap.  Patient notified of plan and instructed to force fluids today.  Pt will be given appointment time for tomorrow when she comes in for radiation today.  Pt also instructed to go to ER if BP drops any lower or she becomes symptomatic.

## 2015-08-19 ENCOUNTER — Other Ambulatory Visit: Payer: Self-pay | Admitting: Internal Medicine

## 2015-08-19 ENCOUNTER — Inpatient Hospital Stay

## 2015-08-19 ENCOUNTER — Ambulatory Visit
Admission: RE | Admit: 2015-08-19 | Discharge: 2015-08-19 | Disposition: A | Discharge status: Home / Self Care | Source: Ambulatory Visit | Attending: Radiation Oncology | Admitting: Radiation Oncology

## 2015-08-19 VITALS — BP 95/60 | HR 105 | Temp 97.2°F

## 2015-08-19 DIAGNOSIS — J90 Pleural effusion, not elsewhere classified: Secondary | ICD-10-CM | POA: Diagnosis not present

## 2015-08-19 DIAGNOSIS — R05 Cough: Secondary | ICD-10-CM | POA: Diagnosis not present

## 2015-08-19 DIAGNOSIS — Z90710 Acquired absence of both cervix and uterus: Secondary | ICD-10-CM | POA: Diagnosis not present

## 2015-08-19 DIAGNOSIS — R5383 Other fatigue: Secondary | ICD-10-CM | POA: Diagnosis not present

## 2015-08-19 DIAGNOSIS — R918 Other nonspecific abnormal finding of lung field: Secondary | ICD-10-CM | POA: Diagnosis not present

## 2015-08-19 DIAGNOSIS — R112 Nausea with vomiting, unspecified: Secondary | ICD-10-CM | POA: Diagnosis not present

## 2015-08-19 DIAGNOSIS — R093 Abnormal sputum: Secondary | ICD-10-CM | POA: Diagnosis not present

## 2015-08-19 DIAGNOSIS — Z79899 Other long term (current) drug therapy: Secondary | ICD-10-CM | POA: Diagnosis not present

## 2015-08-19 DIAGNOSIS — I10 Essential (primary) hypertension: Secondary | ICD-10-CM | POA: Diagnosis not present

## 2015-08-19 DIAGNOSIS — E860 Dehydration: Secondary | ICD-10-CM

## 2015-08-19 DIAGNOSIS — R0602 Shortness of breath: Secondary | ICD-10-CM | POA: Diagnosis not present

## 2015-08-19 DIAGNOSIS — I509 Heart failure, unspecified: Secondary | ICD-10-CM | POA: Diagnosis not present

## 2015-08-19 DIAGNOSIS — R630 Anorexia: Secondary | ICD-10-CM | POA: Diagnosis not present

## 2015-08-19 DIAGNOSIS — Z7952 Long term (current) use of systemic steroids: Secondary | ICD-10-CM | POA: Diagnosis not present

## 2015-08-19 DIAGNOSIS — M797 Fibromyalgia: Secondary | ICD-10-CM | POA: Diagnosis not present

## 2015-08-19 DIAGNOSIS — E7800 Pure hypercholesterolemia, unspecified: Secondary | ICD-10-CM | POA: Diagnosis not present

## 2015-08-19 DIAGNOSIS — R5381 Other malaise: Secondary | ICD-10-CM | POA: Diagnosis not present

## 2015-08-19 DIAGNOSIS — F17210 Nicotine dependence, cigarettes, uncomplicated: Secondary | ICD-10-CM | POA: Diagnosis not present

## 2015-08-19 DIAGNOSIS — Z9013 Acquired absence of bilateral breasts and nipples: Secondary | ICD-10-CM | POA: Diagnosis not present

## 2015-08-19 DIAGNOSIS — I252 Old myocardial infarction: Secondary | ICD-10-CM | POA: Diagnosis not present

## 2015-08-19 DIAGNOSIS — J449 Chronic obstructive pulmonary disease, unspecified: Secondary | ICD-10-CM | POA: Diagnosis not present

## 2015-08-19 DIAGNOSIS — R0789 Other chest pain: Secondary | ICD-10-CM | POA: Diagnosis not present

## 2015-08-19 DIAGNOSIS — Z510 Encounter for antineoplastic radiation therapy: Secondary | ICD-10-CM | POA: Diagnosis not present

## 2015-08-19 DIAGNOSIS — R109 Unspecified abdominal pain: Secondary | ICD-10-CM | POA: Diagnosis not present

## 2015-08-19 DIAGNOSIS — Z85118 Personal history of other malignant neoplasm of bronchus and lung: Secondary | ICD-10-CM | POA: Diagnosis not present

## 2015-08-19 DIAGNOSIS — R634 Abnormal weight loss: Secondary | ICD-10-CM | POA: Diagnosis not present

## 2015-08-19 MED ORDER — SODIUM CHLORIDE 0.9 % IV SOLN
INTRAVENOUS | Status: DC
  Administered 2015-08-19: 14:00:00 via INTRAVENOUS
  Filled 2015-08-19: qty 1000

## 2015-08-19 MED ORDER — SODIUM CHLORIDE 0.9 % IV SOLN
Freq: Once | INTRAVENOUS | Status: AC
  Administered 2015-08-19: 12:00:00 via INTRAVENOUS
  Filled 2015-08-19: qty 1000

## 2015-08-19 NOTE — Progress Notes (Signed)
1340-pt finished 2hr IV fluid. BP 76/55, HR 105. Notified Dr Rogue Bussing who came to see pt chairside. Additional 236m bag NS ordered. 1505-BP rechecked after 2nd bag IV fluid infused. BP 95/60, HR 105. Pt released and sent to radiation for scheduled tx.

## 2015-08-19 NOTE — Telephone Encounter (Signed)
She just started radiation yesterday. I saw the patient in the infusion area today; systolic blood pressures with 70s to 80s. Patient complaining of dizziness/generalized weakness. Patient is on MS Contin 60 mg 3 times a day/morphine short-acting liquid every 2-3 hours/and also oxycodone as needed-for pain. Patient is off her blood pressure medications for the last 2 weeks.   # Suspect that hypertension is likely from her narcotic pain medication. Recommend MS Contin 60 mg twice a day; and continue other narcotic short-acting pain medications. Also recommend doubling the dose of prednisone to 10 mg a day.  # Overall prognosis is poor. I think she is clinically declining/ if she continues to decline- patient is recommended hospice.

## 2015-08-20 ENCOUNTER — Telehealth: Payer: Self-pay | Admitting: *Deleted

## 2015-08-20 ENCOUNTER — Ambulatory Visit

## 2015-08-20 NOTE — Telephone Encounter (Signed)
Called to report that she has not voided since yesterday before she came in for IVF. Not uncomfortable or does not feel the need to void.

## 2015-08-20 NOTE — Telephone Encounter (Signed)
Patient called back and spoke to [facility]. Stated that she has voided some a a few mins ago. She declined an ER visit.

## 2015-08-20 NOTE — Telephone Encounter (Signed)
Per Dr Rogue Bussing patient to go to ER. Patient informed and said it will take her a while to get there

## 2015-08-23 ENCOUNTER — Ambulatory Visit
Admission: RE | Admit: 2015-08-23 | Discharge: 2015-08-23 | Disposition: A | Discharge status: Home / Self Care | Source: Ambulatory Visit | Attending: Radiation Oncology | Admitting: Radiation Oncology

## 2015-08-23 DIAGNOSIS — Z510 Encounter for antineoplastic radiation therapy: Secondary | ICD-10-CM | POA: Diagnosis not present

## 2015-08-24 ENCOUNTER — Ambulatory Visit
Admission: RE | Admit: 2015-08-24 | Discharge: 2015-08-24 | Disposition: A | Discharge status: Home / Self Care | Source: Ambulatory Visit | Attending: Radiation Oncology | Admitting: Radiation Oncology

## 2015-08-24 ENCOUNTER — Ambulatory Visit

## 2015-08-25 ENCOUNTER — Ambulatory Visit
Admission: RE | Admit: 2015-08-25 | Discharge: 2015-08-25 | Disposition: A | Discharge status: Home / Self Care | Source: Ambulatory Visit | Attending: Radiation Oncology | Admitting: Radiation Oncology

## 2015-08-25 DIAGNOSIS — Z510 Encounter for antineoplastic radiation therapy: Secondary | ICD-10-CM | POA: Diagnosis not present

## 2015-08-26 ENCOUNTER — Ambulatory Visit
Admission: RE | Admit: 2015-08-26 | Discharge: 2015-08-26 | Disposition: A | Discharge status: Home / Self Care | Source: Ambulatory Visit | Attending: Radiation Oncology | Admitting: Radiation Oncology

## 2015-08-26 DIAGNOSIS — Z510 Encounter for antineoplastic radiation therapy: Secondary | ICD-10-CM | POA: Diagnosis not present

## 2015-08-27 ENCOUNTER — Ambulatory Visit
Admission: RE | Admit: 2015-08-27 | Discharge: 2015-08-27 | Disposition: A | Discharge status: Home / Self Care | Source: Ambulatory Visit | Attending: Radiation Oncology | Admitting: Radiation Oncology

## 2015-08-27 DIAGNOSIS — Z510 Encounter for antineoplastic radiation therapy: Secondary | ICD-10-CM | POA: Diagnosis not present

## 2015-08-30 ENCOUNTER — Ambulatory Visit
Admission: RE | Admit: 2015-08-30 | Discharge: 2015-08-30 | Disposition: A | Discharge status: Home / Self Care | Source: Ambulatory Visit | Attending: Radiation Oncology | Admitting: Radiation Oncology

## 2015-08-30 DIAGNOSIS — Z510 Encounter for antineoplastic radiation therapy: Secondary | ICD-10-CM | POA: Diagnosis not present

## 2015-08-31 ENCOUNTER — Ambulatory Visit

## 2015-09-01 ENCOUNTER — Ambulatory Visit
Admission: RE | Admit: 2015-09-01 | Discharge: 2015-09-01 | Disposition: A | Discharge status: Home / Self Care | Source: Ambulatory Visit | Attending: Radiation Oncology | Admitting: Radiation Oncology

## 2015-09-01 ENCOUNTER — Ambulatory Visit

## 2015-09-01 DIAGNOSIS — Z510 Encounter for antineoplastic radiation therapy: Secondary | ICD-10-CM | POA: Diagnosis not present

## 2015-09-02 ENCOUNTER — Telehealth: Payer: Self-pay | Admitting: *Deleted

## 2015-09-02 ENCOUNTER — Ambulatory Visit

## 2015-09-02 NOTE — Telephone Encounter (Signed)
Amber Harrison cancelled radiation today and is asking if she should see Dr. B.  For severe left side and back pain that she has had for about a week.  She stated her pain medication is not helping.  Please give her a call back and let her know if you can work her in.

## 2015-09-02 NOTE — Telephone Encounter (Signed)
I contact patient back. Spoke with husband.  I asked husband to bring pt to cancer center at 1045am tomorrow. Husband refused to take this appointment and wanted his wife's approval of apt time. He stated that his wife prefers afternoon appointments.  Pt unavailable at time of call.  Husband stated that wife just went to sleep and he didn't want to wake her.  I asked scheduling to add pt to Dr. Sharmaine Base schedule on 09/03/15 for pain mgmt. Pt has radiation therapy daily 145 pm..  Husband asked for schedulers to contact him in the morning to confirm this apt time.

## 2015-09-03 ENCOUNTER — Ambulatory Visit

## 2015-09-03 ENCOUNTER — Inpatient Hospital Stay: Admitting: Internal Medicine

## 2015-09-06 ENCOUNTER — Ambulatory Visit

## 2015-09-07 ENCOUNTER — Ambulatory Visit
Admission: RE | Admit: 2015-09-07 | Discharge: 2015-09-07 | Disposition: A | Discharge status: Home / Self Care | Source: Ambulatory Visit | Attending: Radiation Oncology | Admitting: Radiation Oncology

## 2015-09-07 DIAGNOSIS — Z510 Encounter for antineoplastic radiation therapy: Secondary | ICD-10-CM | POA: Diagnosis not present

## 2015-09-08 ENCOUNTER — Inpatient Hospital Stay (HOSPITAL_BASED_OUTPATIENT_CLINIC_OR_DEPARTMENT_OTHER): Admitting: Internal Medicine

## 2015-09-08 ENCOUNTER — Ambulatory Visit
Admission: RE | Admit: 2015-09-08 | Discharge: 2015-09-08 | Disposition: A | Discharge status: Home / Self Care | Payer: Medicare Other | Source: Ambulatory Visit | Attending: Internal Medicine | Admitting: Internal Medicine

## 2015-09-08 ENCOUNTER — Ambulatory Visit

## 2015-09-08 ENCOUNTER — Ambulatory Visit
Admission: RE | Admit: 2015-09-08 | Discharge: 2015-09-08 | Disposition: A | Discharge status: Home / Self Care | Source: Ambulatory Visit | Attending: Radiation Oncology | Admitting: Radiation Oncology

## 2015-09-08 VITALS — BP 114/66 | HR 105 | Temp 98.6°F

## 2015-09-08 DIAGNOSIS — R0602 Shortness of breath: Secondary | ICD-10-CM

## 2015-09-08 DIAGNOSIS — X58XXXA Exposure to other specified factors, initial encounter: Secondary | ICD-10-CM | POA: Insufficient documentation

## 2015-09-08 DIAGNOSIS — J9811 Atelectasis: Secondary | ICD-10-CM | POA: Diagnosis not present

## 2015-09-08 DIAGNOSIS — C3491 Malignant neoplasm of unspecified part of right bronchus or lung: Secondary | ICD-10-CM

## 2015-09-08 DIAGNOSIS — R918 Other nonspecific abnormal finding of lung field: Secondary | ICD-10-CM

## 2015-09-08 DIAGNOSIS — R0789 Other chest pain: Secondary | ICD-10-CM | POA: Diagnosis not present

## 2015-09-08 DIAGNOSIS — R093 Abnormal sputum: Secondary | ICD-10-CM

## 2015-09-08 DIAGNOSIS — J449 Chronic obstructive pulmonary disease, unspecified: Secondary | ICD-10-CM

## 2015-09-08 DIAGNOSIS — S2242XA Multiple fractures of ribs, left side, initial encounter for closed fracture: Secondary | ICD-10-CM | POA: Diagnosis not present

## 2015-09-08 DIAGNOSIS — R112 Nausea with vomiting, unspecified: Secondary | ICD-10-CM

## 2015-09-08 DIAGNOSIS — Z85118 Personal history of other malignant neoplasm of bronchus and lung: Secondary | ICD-10-CM

## 2015-09-08 DIAGNOSIS — R0781 Pleurodynia: Secondary | ICD-10-CM | POA: Insufficient documentation

## 2015-09-08 DIAGNOSIS — J90 Pleural effusion, not elsewhere classified: Secondary | ICD-10-CM

## 2015-09-08 DIAGNOSIS — R05 Cough: Secondary | ICD-10-CM

## 2015-09-08 DIAGNOSIS — M4854XA Collapsed vertebra, not elsewhere classified, thoracic region, initial encounter for fracture: Secondary | ICD-10-CM | POA: Diagnosis not present

## 2015-09-08 DIAGNOSIS — R109 Unspecified abdominal pain: Secondary | ICD-10-CM

## 2015-09-08 DIAGNOSIS — R5381 Other malaise: Secondary | ICD-10-CM

## 2015-09-08 DIAGNOSIS — R634 Abnormal weight loss: Secondary | ICD-10-CM

## 2015-09-08 DIAGNOSIS — Z510 Encounter for antineoplastic radiation therapy: Secondary | ICD-10-CM | POA: Diagnosis not present

## 2015-09-08 DIAGNOSIS — K219 Gastro-esophageal reflux disease without esophagitis: Secondary | ICD-10-CM

## 2015-09-08 MED ORDER — PANTOPRAZOLE SODIUM 40 MG PO TBEC: 40.0000 mg | DELAYED_RELEASE_TABLET | Freq: Two times a day (BID) | ORAL | Status: AC

## 2015-09-08 NOTE — Progress Notes (Signed)
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   Harrison had issues with low blood pressure needing to go down on the pain medications. More recently her blood pressures have been stable at home.  However the last few days she complains of nausea with vomiting; also heartburn. She also noted to have left chest wall pain the last 2-3 weeks.    Overall she feels poorly. She continues to take prednisone 5 mg pills 2-3 a day.   Harrison continues to have chronic shortness of breath;  Chronic cough- intermittent yellow sputum. No hemoptysis..  She is tired. Poor appetite.  No unusual headaches or vision changes.  REVIEW OF SYSTEMS:  A complete 10 point review of system is done which is negative except mentioned above/history of present illness.   PAST MEDICAL HISTORY :  Past Medical History  Diagnosis Date  . COPD (chronic obstructive pulmonary disease) ([place])   . Fibromyalgia   . Migraines   . Hypertension   . High cholesterol   . Heart attack ([place]) 2013  . Cancer ([place]) 2013    lung, breast  . Chronic back pain   . Fracture of lumbar spine ([place])   . Emphysema of lung ([place])   . CHF (congestive heart failure) ([place])   . Lung cancer ([place])     PAST SURGICAL HISTORY :   Past Surgical History  Procedure Laterality Date  .  Tonsillectomy    . Eye surgery Bilateral   . Mastectomy Bilateral   . Abdominal hysterectomy      total  . Inguinal hernia repair Right   . Kyphoplasty  2012    FAMILY HISTORY :   Family History  Problem Relation Age of Onset  . CAD Other     SOCIAL HISTORY:   Social History  Substance Use Topics  . Smoking status: Current Every Day Smoker -- 0.50 packs/day for 50 years    Types: Cigarettes  . Smokeless tobacco: Never Used  . Alcohol Use: No    ALLERGIES:  is allergic to aspirin; atorvastatin; ciprofloxacin; macrobid; and methadone.  MEDICATIONS:  Current Outpatient Prescriptions  Medication Sig Dispense Refill  . albuterol (PROVENTIL HFA;VENTOLIN HFA) 108 (90 BASE) MCG/ACT inhaler Inhale 2 puffs into the lungs every 4 (four) hours as needed for wheezing or shortness of breath.    . cholecalciferol (VITAMIN D) 1000 UNITS tablet Take 1,000 Units by mouth daily.    Marland Kitchen docusate sodium (COLACE) 100 MG capsule Take 2 capsules (200 mg total) by mouth 2 (two) times daily. 60 capsule 0  . Fluticasone Furoate-Vilanterol 100-25 MCG/INH AEPB Inhale 1 puff into the lungs daily.    Marland Kitchen guaiFENesin (MUCINEX) 600 MG 12 hr tablet Take 600 mg by mouth 2 (two) times daily.    Marland Kitchen lisinopril (PRINIVIL,ZESTRIL) 20 MG tablet Take 20 mg by mouth 2 (two) times  daily.    . magnesium hydroxide (MILK OF MAGNESIA) 400 MG/5ML suspension Take 30 mLs by mouth daily as needed for moderate constipation or indigestion.    . metoprolol succinate (TOPROL-XL) 100 MG 24 hr tablet Take 100 mg by mouth daily. Take with or immediately following a meal.    . morphine (MS CONTIN) 30 MG 12 hr tablet Take 30 mg by mouth every 12 (twelve) hours.    . nitroGLYCERIN (NITROSTAT) 0.4 MG SL tablet Place 0.4 mg under the tongue every 5 (five) minutes as needed for chest pain.    Marland Kitchen omeprazole (PRILOSEC) 20 MG capsule Take 20 mg by mouth daily.    . ondansetron (ZOFRAN) 4 MG tablet Take 1 tablet (4 mg total) by mouth every 6 (six)  hours as needed for nausea or vomiting. 30 tablet 0  . oxyCODONE (OXY IR/ROXICODONE) 5 MG immediate release tablet Take 10 mg by mouth every 4 (four) hours as needed for severe pain.    . potassium chloride SA (K-DUR,KLOR-CON) 20 MEQ tablet TAKE ONE TABLET TWICE DAILY 20 tablet 0  . pravastatin (PRAVACHOL) 20 MG tablet   11  . predniSONE (DELTASONE) 5 MG tablet Take 1 tablet (5 mg total) by mouth daily with breakfast. 60 tablet 0  . senna (SENOKOT) 8.6 MG TABS tablet Take 1 tablet by mouth at bedtime as needed for mild constipation.    . sorbitol 70 % solution Take 30 mLs by mouth 2 (two) times daily. prn    . tiotropium (SPIRIVA) 18 MCG inhalation capsule Place 18 mcg into inhaler and inhale daily.    Marland Kitchen torsemide (DEMADEX) 20 MG tablet Take 20 mg by mouth daily.    . pantoprazole (PROTONIX) 40 MG tablet Take 1 tablet (40 mg total) by mouth 2 (two) times daily. (Harrison not taking: Reported on 09/08/2015) 60 tablet 6   No current facility-administered medications for this visit.    PHYSICAL EXAMINATION: ECOG PERFORMANCE STATUS: 3 - Symptomatic, >50% confined to bed  BP 114/66 mmHg  Pulse 105  Temp(Src) 98.6 F (37 C) (Tympanic)  SpO2 95%  There were no vitals filed for this visit.  GENERAL: cachectic;  Frail-appearing Caucasian female Harrison;  In a wheelchair;  O2 nasal cannula oxygen.  She is accompanied by multiple family members. EYES: no pallor or icterus OROPHARYNX: no thrush or ulceration NECK: supple, no masses felt LYMPH:  no palpable lymphadenopathy in the cervical, axillary or inguinal regions LUNGS:  Decreased air entry  Left more than right.No crackles. HEART/CVS: regular rate & rhythm and no murmurs; No lower extremity edema ABDOMEN:abdomen soft, non-tender and normal bowel sounds Musculoskeletal:no cyanosis of digits and no clubbing  PSYCH: alert & oriented x 3 with fluent speech NEURO: no focal motor/sensory deficits SKIN:  no rashes or significant  lesions  LABORATORY DATA:  I have reviewed the data as listed    Component Value Date/Time   NA 135 07/21/2015 1112   NA 144 03/23/2014 1413   K 2.7* 07/21/2015 1112   K 3.6 03/23/2014 1413   CL 84* 07/21/2015 1112   CL 102 03/23/2014 1413   CO2 45* 07/21/2015 1112   CO2 37* 03/23/2014 1413   GLUCOSE 108* 07/21/2015 1112   GLUCOSE 107* 03/23/2014 1413   BUN 22* 07/21/2015 1112   BUN 12 03/23/2014 1413   CREATININE 0.74 07/21/2015 1112   CREATININE 0.74 03/23/2014 1413   CALCIUM 8.7* 07/21/2015 1112   CALCIUM 9.5 03/23/2014 1413   PROT 6.9 07/21/2015 1112  PROT 7.0 03/23/2014 1413   ALBUMIN 3.6 07/21/2015 1112   ALBUMIN 3.7 03/23/2014 1413   AST 15 07/21/2015 1112   AST 14* 03/23/2014 1413   ALT 12* 07/21/2015 1112   ALT 17 03/23/2014 1413   ALKPHOS 66 07/21/2015 1112   ALKPHOS 60 03/23/2014 1413   BILITOT 1.2 07/21/2015 1112   BILITOT 0.8 03/23/2014 1413   GFRNONAA >60 07/21/2015 1112   GFRNONAA >60 03/23/2014 1413   GFRNONAA >60 09/24/2013 1431   GFRAA >60 07/21/2015 1112   GFRAA >60 03/23/2014 1413   GFRAA >60 09/24/2013 1431    No results found for: SPEP, UPEP  Lab Results  Component Value Date   WBC 8.8 07/21/2015   NEUTROABS 6.7* 07/21/2015   HGB 14.7 07/21/2015   HCT 42.4 07/21/2015   MCV 99.2 07/21/2015   PLT 229 07/21/2015      Chemistry      Component Value Date/Time   NA 135 07/21/2015 1112   NA 144 03/23/2014 1413   K 2.7* 07/21/2015 1112   K 3.6 03/23/2014 1413   CL 84* 07/21/2015 1112   CL 102 03/23/2014 1413   CO2 45* 07/21/2015 1112   CO2 37* 03/23/2014 1413   BUN 22* 07/21/2015 1112   BUN 12 03/23/2014 1413   CREATININE 0.74 07/21/2015 1112   CREATININE 0.74 03/23/2014 1413      Component Value Date/Time   CALCIUM 8.7* 07/21/2015 1112   CALCIUM 9.5 03/23/2014 1413   ALKPHOS 66 07/21/2015 1112   ALKPHOS 60 03/23/2014 1413   AST 15 07/21/2015 1112   AST 14* 03/23/2014 1413   ALT 12* 07/21/2015 1112   ALT 17 03/23/2014 1413    BILITOT 1.2 07/21/2015 1112   BILITOT 0.8 03/23/2014 1413         ASSESSMENT & PLAN:   #  History of squamous cell lung cancer stage III 2012;  Recurrence suspected on imaging [no biopsy; high risk for complications]  Right upper lobe/ hilar mass/  Small pleural effusion on the right side/  Adrenal lesion; 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 Harrison is currently on palliative radiation- state that she has one more week of radiation. For now recommend a follow-up CT scan in few weeks post radiation.  # #   Given the poor performance status/ severe co-morbidities/ Harrison has been in hospice before. If Harrison continues to decline- hospice would be recommended again. Harrison is a poor candidate for any systemic therapies.  # Left chest wall pain unclear etiology; check chest x-ray. Continue current pain medication.  #  Poor appetite weight loss;  Recommend prednisone 5 mg once a day;  If needed 2-3 pills a day.   # Abdominal discomfort- likely gastritis from chronic prednisone use. Protonix twice a day prescription given.  # Follow up with him with labs as scheduled; to call Harrison regarding chest x-ray.  Overall extremely poor prognosis. We will discuss with Dr. Donella Stade.     Cammie Sickle, MD 09/08/2015 4:36 PM

## 2015-09-09 ENCOUNTER — Ambulatory Visit

## 2015-09-10 ENCOUNTER — Ambulatory Visit

## 2015-09-13 ENCOUNTER — Ambulatory Visit

## 2015-09-13 ENCOUNTER — Ambulatory Visit
Admission: RE | Admit: 2015-09-13 | Discharge: 2015-09-13 | Disposition: A | Discharge status: Home / Self Care | Source: Ambulatory Visit | Attending: Radiation Oncology | Admitting: Radiation Oncology

## 2015-09-13 DIAGNOSIS — Z510 Encounter for antineoplastic radiation therapy: Secondary | ICD-10-CM | POA: Diagnosis not present

## 2015-09-14 ENCOUNTER — Ambulatory Visit

## 2015-09-14 ENCOUNTER — Ambulatory Visit
Admission: RE | Admit: 2015-09-14 | Discharge: 2015-09-14 | Disposition: A | Discharge status: Home / Self Care | Source: Ambulatory Visit | Attending: Radiation Oncology | Admitting: Radiation Oncology

## 2015-09-14 ENCOUNTER — Other Ambulatory Visit: Payer: Self-pay | Admitting: *Deleted

## 2015-09-14 DIAGNOSIS — Z510 Encounter for antineoplastic radiation therapy: Secondary | ICD-10-CM | POA: Diagnosis not present

## 2015-09-14 MED ORDER — FLUCONAZOLE 100 MG PO TABS: 100.0000 mg | ORAL_TABLET | Freq: Every day | ORAL | Status: AC

## 2015-09-15 ENCOUNTER — Ambulatory Visit

## 2015-09-15 DIAGNOSIS — Z510 Encounter for antineoplastic radiation therapy: Secondary | ICD-10-CM | POA: Diagnosis not present

## 2015-09-16 ENCOUNTER — Ambulatory Visit

## 2015-09-16 DIAGNOSIS — Z510 Encounter for antineoplastic radiation therapy: Secondary | ICD-10-CM | POA: Diagnosis not present

## 2015-09-22 ENCOUNTER — Inpatient Hospital Stay: Payer: Medicare Other | Admitting: Internal Medicine

## 2015-09-22 ENCOUNTER — Ambulatory Visit: Payer: Medicare Other | Admitting: Internal Medicine

## 2015-09-22 ENCOUNTER — Inpatient Hospital Stay: Payer: Medicare Other

## 2015-09-22 ENCOUNTER — Other Ambulatory Visit: Payer: Medicare Other

## 2015-09-24 ENCOUNTER — Other Ambulatory Visit: Payer: Self-pay | Admitting: Internal Medicine

## 2015-09-24 NOTE — Telephone Encounter (Signed)
Last K+ 2 months ago was 2.7

## 2015-10-06 ENCOUNTER — Inpatient Hospital Stay: Payer: Medicare Other

## 2015-10-06 ENCOUNTER — Inpatient Hospital Stay: Payer: Medicare Other | Admitting: Internal Medicine

## 2015-10-14 ENCOUNTER — Ambulatory Visit: Payer: Medicare Other | Admitting: Radiation Oncology

## 2015-10-15 ENCOUNTER — Emergency Department
Admission: EM | Admit: 2015-10-15 | Discharge: 2015-11-13 | Disposition: E | Attending: Emergency Medicine | Admitting: Emergency Medicine

## 2015-10-15 DIAGNOSIS — F17210 Nicotine dependence, cigarettes, uncomplicated: Secondary | ICD-10-CM | POA: Insufficient documentation

## 2015-10-15 DIAGNOSIS — J449 Chronic obstructive pulmonary disease, unspecified: Secondary | ICD-10-CM | POA: Insufficient documentation

## 2015-10-15 DIAGNOSIS — I509 Heart failure, unspecified: Secondary | ICD-10-CM | POA: Diagnosis not present

## 2015-10-15 DIAGNOSIS — I2111 ST elevation (STEMI) myocardial infarction involving right coronary artery: Secondary | ICD-10-CM | POA: Insufficient documentation

## 2015-10-15 DIAGNOSIS — Z853 Personal history of malignant neoplasm of breast: Secondary | ICD-10-CM | POA: Diagnosis not present

## 2015-10-15 DIAGNOSIS — I2510 Atherosclerotic heart disease of native coronary artery without angina pectoris: Secondary | ICD-10-CM | POA: Insufficient documentation

## 2015-10-15 DIAGNOSIS — I110 Hypertensive heart disease with heart failure: Secondary | ICD-10-CM | POA: Insufficient documentation

## 2015-10-15 DIAGNOSIS — M5136 Other intervertebral disc degeneration, lumbar region: Secondary | ICD-10-CM | POA: Diagnosis not present

## 2015-10-15 DIAGNOSIS — Z79899 Other long term (current) drug therapy: Secondary | ICD-10-CM | POA: Diagnosis not present

## 2015-10-15 DIAGNOSIS — E785 Hyperlipidemia, unspecified: Secondary | ICD-10-CM | POA: Insufficient documentation

## 2015-10-16 MED FILL — Medication: Qty: 1 | Status: AC

## 2015-10-21 ENCOUNTER — Ambulatory Visit: Payer: Medicare Other | Admitting: Radiation Oncology

## 2015-11-13 DIAGNOSIS — 419620001 Death: Secondary | SNOMED CT | POA: Diagnosis not present

## 2015-11-13 NOTE — ED Provider Notes (Signed)
[facility] Emergency Department Provider Note  ____________________________________________  Time seen: 4:00 PM on arrival by EMS  I have reviewed the triage vital signs and the nursing notes.   HISTORY  Chief Complaint Code STEMI Level 5 caveat:  Portions of the history and physical were unable to be obtained due to the patient's acute illness and unresponsiveness    HPI Amber Harrison is a 75 y.o. female brought to the ED by EMS due to sudden collapse at home. EMS report that although they were told verbally that the patient had a DO NOT RESUSCITATE directive, family were unable to produce documentation, and since the patient is critically ill when they arrived on scene and found that she was suffering cardiac arrest, they provided CPR. They placed a King airway for ventilation. With 15-20 minutes of CPR, 2 rounds of epinephrine, the patient regained spontaneous circulation. A post arrest EKG obtained from EMS and transmitted to the emergency department shows a clear inferior STEMI. Despite regaining circulation, the patient has remained obtunded.     Past Medical History  Diagnosis Date  . COPD (chronic obstructive pulmonary disease) ([place])   . Fibromyalgia   . Migraines   . Hypertension   . High cholesterol   . Heart attack ([place]) 2013  . Cancer ([place]) 2013    lung, breast  . Chronic back pain   . Fracture of lumbar spine ([place])   . Emphysema of lung ([place])   . CHF (congestive heart failure) ([place])   . Lung cancer [facility])      Patient Active Problem List   Diagnosis Date Noted  . Dehydration 08/19/2015  . Pleural cavity effusion 06/24/2015  . Chest pain 06/23/2015  . Benign essential HTN 04/19/2015  . Edema leg 04/19/2015  . Combined fat and carbohydrate induced hyperlipemia 04/19/2015  . Hypertensive left ventricular hypertrophy 04/19/2015  . Venous insufficiency of leg 04/19/2015  . Urinary retention 10/10/2014  . Constipation due to opioid  therapy 10/10/2014  . Chronic low back pain 10/10/2014  . Sepsis ([place]) 10/04/2014  . Essential hypertension 10/04/2014  . Hyperlipidemia 10/04/2014  . Coronary artery disease 10/04/2014  . COPD (chronic obstructive pulmonary disease) ([place]) 10/04/2014  . Lumbar canal stenosis 07/07/2014  . 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 Degeneration of intervertebral disc of lumbar region 05/26/2014  . Neuritis or radiculitis due to rupture of lumbar intervertebral disc 05/26/2014  . Breath shortness 11/18/2013  . Severe chronic obstructive pulmonary disease ([place]) 10/07/2013  . H/O malignant neoplasm 10/07/2013     Past Surgical History  Procedure Laterality Date  . Tonsillectomy    . Eye surgery Bilateral   . Mastectomy Bilateral   . Abdominal hysterectomy      total  . Inguinal hernia repair Right   . Kyphoplasty  2012     Current Outpatient Rx  Name  Route  Sig  Dispense  Refill  . albuterol (PROVENTIL HFA;VENTOLIN HFA) 108 (90 BASE) MCG/ACT inhaler   Inhalation   Inhale 2 puffs into the lungs every 4 (four) hours as needed for wheezing or shortness of breath.         . cholecalciferol (VITAMIN D) 1000 UNITS tablet   Oral   Take 1,000 Units by mouth daily.         Marland Kitchen docusate sodium (COLACE) 100 MG capsule   Oral   Take 2 capsules (200 mg total) by mouth 2 (two) times daily.  60 capsule   0   . fluconazole (DIFLUCAN) 100 MG tablet   Oral   Take 1 tablet (100 mg total) by mouth daily.   5 tablet   0   . Fluticasone Furoate-Vilanterol 100-25 MCG/INH AEPB   Inhalation   Inhale 1 puff into the lungs daily.         Marland Kitchen guaiFENesin (MUCINEX) 600 MG 12 hr tablet   Oral   Take 600 mg by mouth 2 (two) times daily.         Marland Kitchen lisinopril (PRINIVIL,ZESTRIL) 20 MG tablet   Oral   Take 20 mg by mouth 2 (two) times daily.         . magnesium hydroxide (MILK OF MAGNESIA) 400 MG/5ML suspension   Oral   Take 30 mLs  by mouth daily as needed for moderate constipation or indigestion.         . metoprolol succinate (TOPROL-XL) 100 MG 24 hr tablet   Oral   Take 100 mg by mouth daily. Take with or immediately following a meal.         . morphine (MS CONTIN) 30 MG 12 hr tablet   Oral   Take 30 mg by mouth every 12 (twelve) hours.         . nitroGLYCERIN (NITROSTAT) 0.4 MG SL tablet   Sublingual   Place 0.4 mg under the tongue every 5 (five) minutes as needed for chest pain.         Marland Kitchen omeprazole (PRILOSEC) 20 MG capsule   Oral   Take 20 mg by mouth daily.         . ondansetron (ZOFRAN) 4 MG tablet   Oral   Take 1 tablet (4 mg total) by mouth every 6 (six) hours as needed for nausea or vomiting.   30 tablet   0   . oxyCODONE (OXY IR/ROXICODONE) 5 MG immediate release tablet   Oral   Take 10 mg by mouth every 4 (four) hours as needed for severe pain.         . pantoprazole (PROTONIX) 40 MG tablet   Oral   Take 1 tablet (40 mg total) by mouth 2 (two) times daily. Patient not taking: Reported on 09/08/2015   60 tablet   6   . potassium chloride SA (K-DUR,KLOR-CON) 20 MEQ tablet      TAKE ONE TABLET TWICE DAILY   20 tablet   0   . pravastatin (PRAVACHOL) 20 MG tablet            11   . predniSONE (DELTASONE) 5 MG tablet   Oral   Take 1 tablet (5 mg total) by mouth daily with breakfast.   60 tablet   0   . senna (SENOKOT) 8.6 MG TABS tablet   Oral   Take 1 tablet by mouth at bedtime as needed for mild constipation.         . sorbitol 70 % solution   Oral   Take 30 mLs by mouth 2 (two) times daily. prn         . tiotropium (SPIRIVA) 18 MCG inhalation capsule   Inhalation   Place 18 mcg into inhaler and inhale daily.         Marland Kitchen torsemide (DEMADEX) 20 MG tablet   Oral   Take 20 mg by mouth daily.            Allergies Aspirin; Atorvastatin; Ciprofloxacin; Macrobid; and Methadone   Family History  Problem Relation Age of Onset  . CAD Other      Social History Social History  Substance Use Topics  . Smoking status: Current Every Day Smoker -- 0.50 packs/day for 50 years    Types: Cigarettes  . Smokeless tobacco: Never Used  . Alcohol Use: No    Review of Systems Unable to obtain ____________________________________________   PHYSICAL EXAM:  VITAL SIGNS: ED Triage Vitals  Enc Vitals Group     BP --      Pulse --      Resp --      Temp --      Temp src --      SpO2 --      Weight November 02, 2015 1634 93 lb (42.185 kg)     Height --      Head Cir --      Peak Flow --      Pain Score --      Pain Loc --      Pain Edu? --      Excl. in [place]? --     Vital signs reviewed, nursing assessments reviewed.   Constitutional:   Obtunded, unresponsive. No spontaneous respirations Eyes:   No icterus. Pupils are irregularly shaped, postprocedural, nonreactive bilaterally.Marland Kitchen ENT   Head:   Normocephalic and atraumatic.   Nose:   No evidence of trauma.   Mouth/Throat:   King airway in place on arrival.    Neck:   . No SubQ emphysema. No meningismus. Hematological/Lymphatic/Immunilogical:   No cervical lymphadenopathy. Cardiovascular:   RRR heart rate 90. Symmetric bilateral radial and DP pulses.  No murmurs.  Respiratory:   Symmetric air entry in all lung fields with bag ventilation.. No wheezing. Gastrointestinal:   Soft  Non distended. No ecchymosis Genitourinary:   Normal general exam Musculoskeletal:   Nontender with normal range of motion in all extremities. No joint effusions.  No lower extremity tenderness.  No edema. Intraosseous catheter in the lower extremity Neurologic:   GCS 3.  Skin:    Skin is warm, dry and intact. No rash noted.  No petechiae, purpura, or bullae.  ____________________________________________    LABS (pertinent positives/negatives) (all labs ordered are listed, but only abnormal results are displayed) Labs Reviewed - No data to  display ____________________________________________   EKG  EKG performed by EMS at 1552 interpreted by me Sinus tachycardia rate 137, left axis, normal intervals. Right bundle branch block, ST elevation in inferior leads with reciprocal ST depression and T-wave inversion in anterior and lateral leads consistent with STEMI  EKG obtained in the emergency department at 16 12 PM interpreted by me Sinus bradycardia rate 48, normal axis, normal intervals. Right bundle branch block. ST elevation in inferior leads with reciprocal ST depression and T-wave inversion in anterior and lateral leads, consistent with ongoing STEMI  EKG performed at 1614 p.m. interpreted by me Right axis, indeterminate rhythm, appears to be likely a ventricular escape rhythm with a rate of about 140 ____________________________________________    RADIOLOGY    ____________________________________________   PROCEDURES CRITICAL CARE Performed by: Joni Fears, Leathia Farnell   Total critical care time: 15 minutes  Critical care time was exclusive of separately billable procedures and treating other patients.  Critical care was necessary to treat or prevent imminent or life-threatening deterioration.  Critical care was time spent personally by me on the following activities: development of treatment plan with patient and/or surrogate as well as nursing, discussions with consultants, evaluation of patient's response to treatment, examination  of patient, obtaining history from patient or surrogate, ordering and performing treatments and interventions, ordering and review of laboratory studies, ordering and review of radiographic studies, pulse oximetry and re-evaluation of patient's condition.  Cardiopulmonary Resuscitation (CPR) Procedure Note Directed/Performed by: Carrie Mew I personally directed ancillary staff and/or performed CPR in an effort to regain return of spontaneous circulation and to maintain cardiac,  neuro and systemic perfusion.  Total CPR time 2 minutes   ____________________________________________   INITIAL IMPRESSION / ASSESSMENT AND PLAN / ED COURSE  Pertinent labs & imaging results that were available during my care of the patient were reviewed by me and considered in my medical decision making (see chart for details).  Patient presents after resuscitation from cardiac arrest. Due to lack of clarity about CODE STATUS a code STEMI was called. We also prepared to reintubate the patient for a secure airway if the patient were to undergo cardiac catheterization. However, shortly thereafter, the hospice coordinator arrived to the bedside and stated that the patient is an active hospice patient due to end-stage lung cancer with a very poor prognosis. She is highly confident that she knows this patient well and that the patient has a DO NOT RESUSCITATE directive and would not want CPR or all of these invasive advanced measures.  Dr. Fletcher Anon arrived to the bedside after evaluating the EKG and looking at the patient's risk factors and determined that this patient is not a candidate for emergent cardiac catheterization.  At 1614 the patient appeared to enter into a pulseless ventricular tachycardia. Because the family had not yet arrived and we were not yet able to fully be sure of her CODE STATUS, CPR was performed. The next minute, Santiago Glad the hospice coordinator was on the phone with the social worker who again verified that this patient has a DO NOT RESUSCITATE order. 1 minute later the family arrived in the emergency department and were able to confirm the patient would not want these at this measures so a 1616 resuscitative efforts were stopped and the patient was pronounced dead.  Family is brought to the bedside, all questions were answered. Chaplain was available. Santiago Glad the hospice coordinator reported that she would contact the primary care  doctor.     ____________________________________________   FINAL CLINICAL IMPRESSION(S) / ED DIAGNOSES  Final diagnoses:  ST elevation myocardial infarction involving right coronary artery [facility])  Death       Portions of this note were generated with dragon dictation software. Dictation errors may occur despite best attempts at proofreading.   Carrie Mew, MD 10-28-2015 [phone]

## 2015-11-13 NOTE — Progress Notes (Signed)
ED visit made following call from patient's hospice RN that she was on her way to the [facility] ED. EMS was called by family when the she became unresponsive, patient is a DNR with portable DNR in place in the home, it was unable to be located. Patient received CPR and resuscitation at home as well as on arrival to the ED. Writer spoke with ED staff Rn and attending physician Dr. Joni Fears to alert them of patient's DNR status. Patient's family arrived and after speaking with attending physician and writer it was confirmed that Mrs. Divirgilio would not want any further aggressive or heroic measures. She was pronounced by attending Dr. Joni Fears at 4;16 pm. Family supported by hospital [place] and Probation officer. Hospice team updated. Thank you Flo Shanks RN, BSN, [place] [facility] [place], [facility] [phone]

## 2015-11-13 DEATH — deceased

## 2016-04-22 ENCOUNTER — Other Ambulatory Visit: Payer: Self-pay | Admitting: Nurse Practitioner

## 2016-11-14 NOTE — Telephone Encounter (Signed)
done

## 2018-02-10 IMAGING — PT NM PET TUM IMG RESTAG (PS) SKULL BASE T - THIGH
1 of 10 series · 1 of 25 positions shown · non-contrast
Comparison: Multiple exams, including 05/21/2015

CLINICAL DATA: Subsequent treatment strategy for lung cancer.

EXAM:
NUCLEAR MEDICINE PET SKULL BASE TO THIGH
TECHNIQUE: 13.1 mCi F-18 FDG was injected intravenously. Full-ring PET imaging
was performed from the skull base to thigh after the radiotracer. CT
data was obtained and used for attenuation correction and anatomic
localization.
FASTING BLOOD GLUCOSE:  Value: 98 mg/dl

[Series 3: ct wb 5.0 b30f · axial · 5.0mm · 0.98mm/px · 1 of 251 slices shown]
[im 251/251  brain]
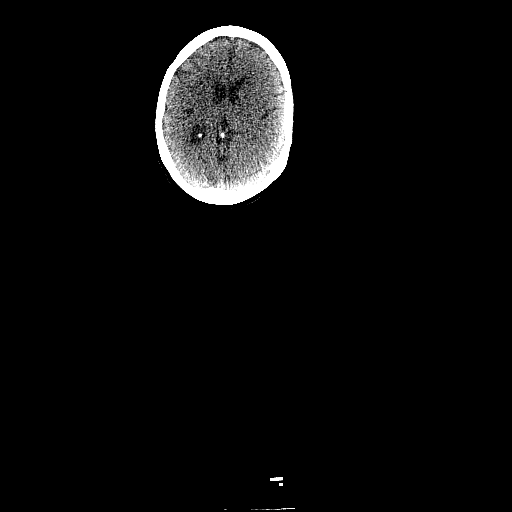

[1 of 25 positions shown; findings below may reference images not displayed]

FINDINGS: NECK

Symmetric glottic activity thought to likely be a physiologic.

CHEST

Right infrahilar mass with the at highly metabolic component
measuring approximately 5.4 by 3.2 cm and with maximum standard
uptake value 11.8. Right hilar lymph node measuring 1.1 cm in short
axis, maximum standard uptake value 7.3.

Right infrahilar node below the right mainstem bronchus, difficult
to measure in size, maximum standard uptake value 7.6.

Other smaller paratracheal lymph nodes are likely hypermetabolic.
There is a likely malignant right pleural effusion, activity
anteriorly up to 4.2 adjacent to atelectatic lung.

5 mm nonspecific right upper lobe pulmonary nodule, image 70 series
3, not hypermetabolic but below sensitive PET-CT size thresholds.
Atelectasis of much of the right lower lobe.

Severe emphysema. Thoracic aortic aneurysm as described on recent
chest CT, not appreciably changed. Cardiomegaly is present.
Coronary, aortic arch, and branch vessel atherosclerotic vascular
disease.

ABDOMEN/PELVIS

Right adrenal mass proximally 1.9 by 2.2 cm on image 120 series 3,
maximum standard uptake value 6.8, compatible with malignancy. There
is a small focus of hypermetabolic activity along the pancreatic
tail, maximum standard uptake value 4.7, possibly a metastatic
lesion.

Aortoiliac atherosclerotic vascular disease. Numerous tiny
gallstones in the gallbladder. The liver and gallbladder extend down
into the pelvis.

Hernia mesh in the right lower quadrant. Scattered sigmoid colon
diverticula. Prominent stool throughout the colon favors
constipation. Physiologic activity in bowel in the anterior pelvis.
Cystocele noted.

SKELETON

No focal hypermetabolic activity to suggest skeletal metastasis.

Accentuated metabolic activity associated with healing fractures of
the left anterior eighth, ninth, and tenth ribs. Faintly increased
activity associated with thoracolumbar compression fractures and
vertebral augmentations.
IMPRESSION: 1. Hypermetabolic right lower lobe infrahilar mass, measuring up to
about 5.4 cm in long axis, maximum standard uptake value 11.8, with
associated atelectasis of most of the right lower lobe and some
postobstructive pneumonitis.
2. Hypermetabolic right hilar and infrahilar lymph nodes compatible
with malignancy. There is a hypermetabolic right adrenal mass also
compatible with malignancy. Likely malignant right pleural effusion.
3. Other imaging findings of potential clinical significance:
Emphysema. Thoracic aortic aneurysm as described on recent chest CT,
not changed. Cardiomegaly. Coronary, aortic arch, and branch vessel
atherosclerotic vascular disease. Aortoiliac atherosclerotic
vascular disease. Cholelithiasis. Prominent stool throughout the
colon favors constipation. Pelvic floor laxity with cystocele.
Accentuated metabolic activity associated with the healing but
likely benign fractures the left anterior eighth, ninth, and tenth
ribs.
